# Patient Record
Sex: Female | Born: 1935
Health system: Southern US, Community
[De-identification: ages and names within clinical notes are randomized; demographics above are authoritative.]

## PROBLEM LIST (undated history)

## (undated) DIAGNOSIS — H905 Unspecified sensorineural hearing loss: Secondary | ICD-10-CM

## (undated) DIAGNOSIS — E785 Hyperlipidemia, unspecified: Secondary | ICD-10-CM

## (undated) DIAGNOSIS — K5790 Diverticulosis of intestine, part unspecified, without perforation or abscess without bleeding: Secondary | ICD-10-CM

## (undated) DIAGNOSIS — H9190 Unspecified hearing loss, unspecified ear: Secondary | ICD-10-CM

## (undated) DIAGNOSIS — K649 Unspecified hemorrhoids: Secondary | ICD-10-CM

## (undated) DIAGNOSIS — K219 Gastro-esophageal reflux disease without esophagitis: Secondary | ICD-10-CM

## (undated) DIAGNOSIS — T7840XA Allergy, unspecified, initial encounter: Secondary | ICD-10-CM

## (undated) DIAGNOSIS — I2510 Atherosclerotic heart disease of native coronary artery without angina pectoris: Secondary | ICD-10-CM

## (undated) DIAGNOSIS — Z780 Asymptomatic menopausal state: Secondary | ICD-10-CM

## (undated) DIAGNOSIS — I10 Essential (primary) hypertension: Secondary | ICD-10-CM

## (undated) DIAGNOSIS — M8580 Other specified disorders of bone density and structure, unspecified site: Secondary | ICD-10-CM

## (undated) HISTORY — DX: Atherosclerotic heart disease of native coronary artery without angina pectoris: I25.10

## (undated) HISTORY — DX: Allergy, unspecified, initial encounter: T78.40XA

## (undated) HISTORY — DX: Other specified disorders of bone density and structure, unspecified site: M85.80

## (undated) HISTORY — DX: Essential (primary) hypertension: I10

## (undated) HISTORY — DX: Diverticulosis of intestine, part unspecified, without perforation or abscess without bleeding: K57.90

## (undated) HISTORY — DX: Unspecified hearing loss, unspecified ear: H91.90

## (undated) HISTORY — DX: Hyperlipidemia, unspecified: E78.5

## (undated) HISTORY — DX: Asymptomatic menopausal state: Z78.0

## (undated) HISTORY — DX: Unspecified sensorineural hearing loss: H90.5

## (undated) HISTORY — DX: Unspecified hemorrhoids: K64.9

## (undated) HISTORY — DX: Gastro-esophageal reflux disease without esophagitis: K21.9

---

## 1997-07-29 ENCOUNTER — Other Ambulatory Visit: Admission: RE | Admit: 1997-07-29 | Discharge: 1997-07-29 | Payer: Self-pay | Admitting: Obstetrics and Gynecology

## 1999-04-29 ENCOUNTER — Other Ambulatory Visit: Admission: RE | Admit: 1999-04-29 | Discharge: 1999-04-29 | Payer: Self-pay | Admitting: Obstetrics and Gynecology

## 1999-04-30 ENCOUNTER — Encounter (INDEPENDENT_AMBULATORY_CARE_PROVIDER_SITE_OTHER): Payer: Self-pay

## 1999-04-30 ENCOUNTER — Other Ambulatory Visit: Admission: RE | Admit: 1999-04-30 | Discharge: 1999-04-30 | Payer: Self-pay | Admitting: Obstetrics and Gynecology

## 1999-07-02 LAB — HM MAMMOGRAPHY: HM Mammogram: NEGATIVE

## 1999-08-03 ENCOUNTER — Encounter: Payer: Self-pay | Admitting: Obstetrics and Gynecology

## 1999-08-04 ENCOUNTER — Ambulatory Visit (HOSPITAL_COMMUNITY): Admission: RE | Admit: 1999-08-04 | Discharge: 1999-08-04 | Payer: Self-pay | Admitting: Obstetrics and Gynecology

## 1999-08-04 ENCOUNTER — Encounter (INDEPENDENT_AMBULATORY_CARE_PROVIDER_SITE_OTHER): Payer: Self-pay

## 2000-01-05 DIAGNOSIS — I2510 Atherosclerotic heart disease of native coronary artery without angina pectoris: Secondary | ICD-10-CM

## 2000-01-05 HISTORY — DX: Atherosclerotic heart disease of native coronary artery without angina pectoris: I25.10

## 2000-02-29 ENCOUNTER — Emergency Department (HOSPITAL_COMMUNITY): Admission: EM | Admit: 2000-02-29 | Discharge: 2000-02-29 | Payer: Self-pay | Admitting: Emergency Medicine

## 2000-06-06 ENCOUNTER — Encounter: Payer: Self-pay | Admitting: *Deleted

## 2000-06-06 ENCOUNTER — Ambulatory Visit (HOSPITAL_COMMUNITY): Admission: RE | Admit: 2000-06-06 | Discharge: 2000-06-07 | Payer: Self-pay | Admitting: *Deleted

## 2001-01-04 HISTORY — PX: COLONOSCOPY: SHX174

## 2001-02-07 ENCOUNTER — Other Ambulatory Visit: Admission: RE | Admit: 2001-02-07 | Discharge: 2001-02-07 | Payer: Self-pay | Admitting: Obstetrics and Gynecology

## 2001-03-30 ENCOUNTER — Ambulatory Visit (HOSPITAL_COMMUNITY): Admission: RE | Admit: 2001-03-30 | Discharge: 2001-03-30 | Payer: Self-pay | Admitting: *Deleted

## 2001-03-30 ENCOUNTER — Encounter: Payer: Self-pay | Admitting: *Deleted

## 2002-04-27 ENCOUNTER — Other Ambulatory Visit: Admission: RE | Admit: 2002-04-27 | Discharge: 2002-04-27 | Payer: Self-pay | Admitting: Obstetrics and Gynecology

## 2002-09-12 ENCOUNTER — Ambulatory Visit (HOSPITAL_COMMUNITY): Admission: RE | Admit: 2002-09-12 | Discharge: 2002-09-12 | Payer: Self-pay | Admitting: Gastroenterology

## 2004-04-23 ENCOUNTER — Ambulatory Visit (HOSPITAL_COMMUNITY): Admission: RE | Admit: 2004-04-23 | Discharge: 2004-04-23 | Payer: Self-pay | Admitting: *Deleted

## 2004-06-10 ENCOUNTER — Encounter: Admission: RE | Admit: 2004-06-10 | Discharge: 2004-06-10 | Payer: Self-pay | Admitting: Family Medicine

## 2004-10-19 ENCOUNTER — Other Ambulatory Visit: Admission: RE | Admit: 2004-10-19 | Discharge: 2004-10-19 | Payer: Self-pay | Admitting: Family Medicine

## 2005-09-22 ENCOUNTER — Ambulatory Visit: Payer: Self-pay | Admitting: Family Medicine

## 2006-06-02 ENCOUNTER — Other Ambulatory Visit: Admission: RE | Admit: 2006-06-02 | Discharge: 2006-06-02 | Payer: Self-pay | Admitting: Family Medicine

## 2006-06-02 ENCOUNTER — Ambulatory Visit: Payer: Self-pay | Admitting: Family Medicine

## 2006-06-02 LAB — HM PAP SMEAR: HM Pap smear: NORMAL

## 2006-09-21 ENCOUNTER — Ambulatory Visit: Payer: Self-pay | Admitting: Family Medicine

## 2006-11-16 ENCOUNTER — Ambulatory Visit: Payer: Self-pay | Admitting: Family Medicine

## 2007-02-09 IMAGING — CR DG CHEST 2V
2 series · 2 of 2 positions shown · non-contrast
Comparison: none

CLINICAL DATA: Abnormal stress test.  Pre-cardiac catheterization.
 2-VIEW CHEST ? 04/23/04: 
 The lungs are clear with mild interstitial coarsening.  The heart size is within normal limits.  The bony structures of the imaged thorax are intact.

[view not recorded (1 of 2)]
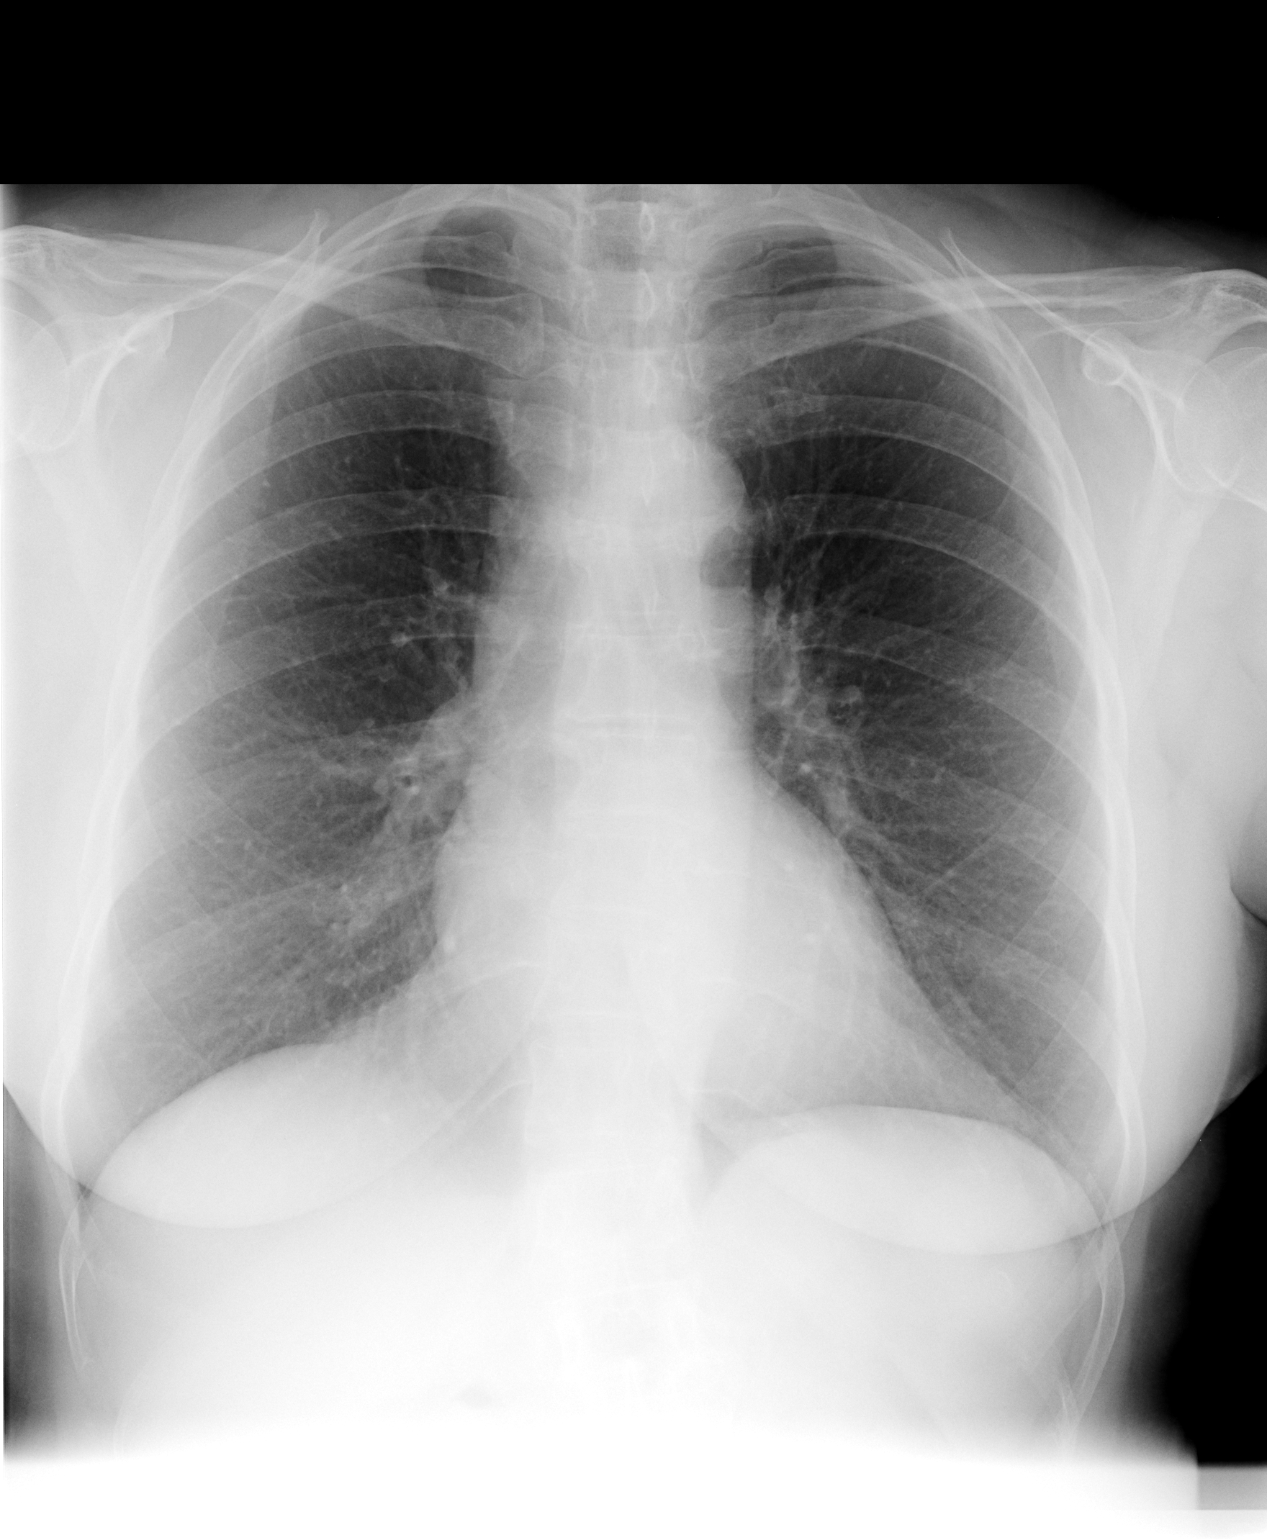

[view not recorded (2 of 2)]
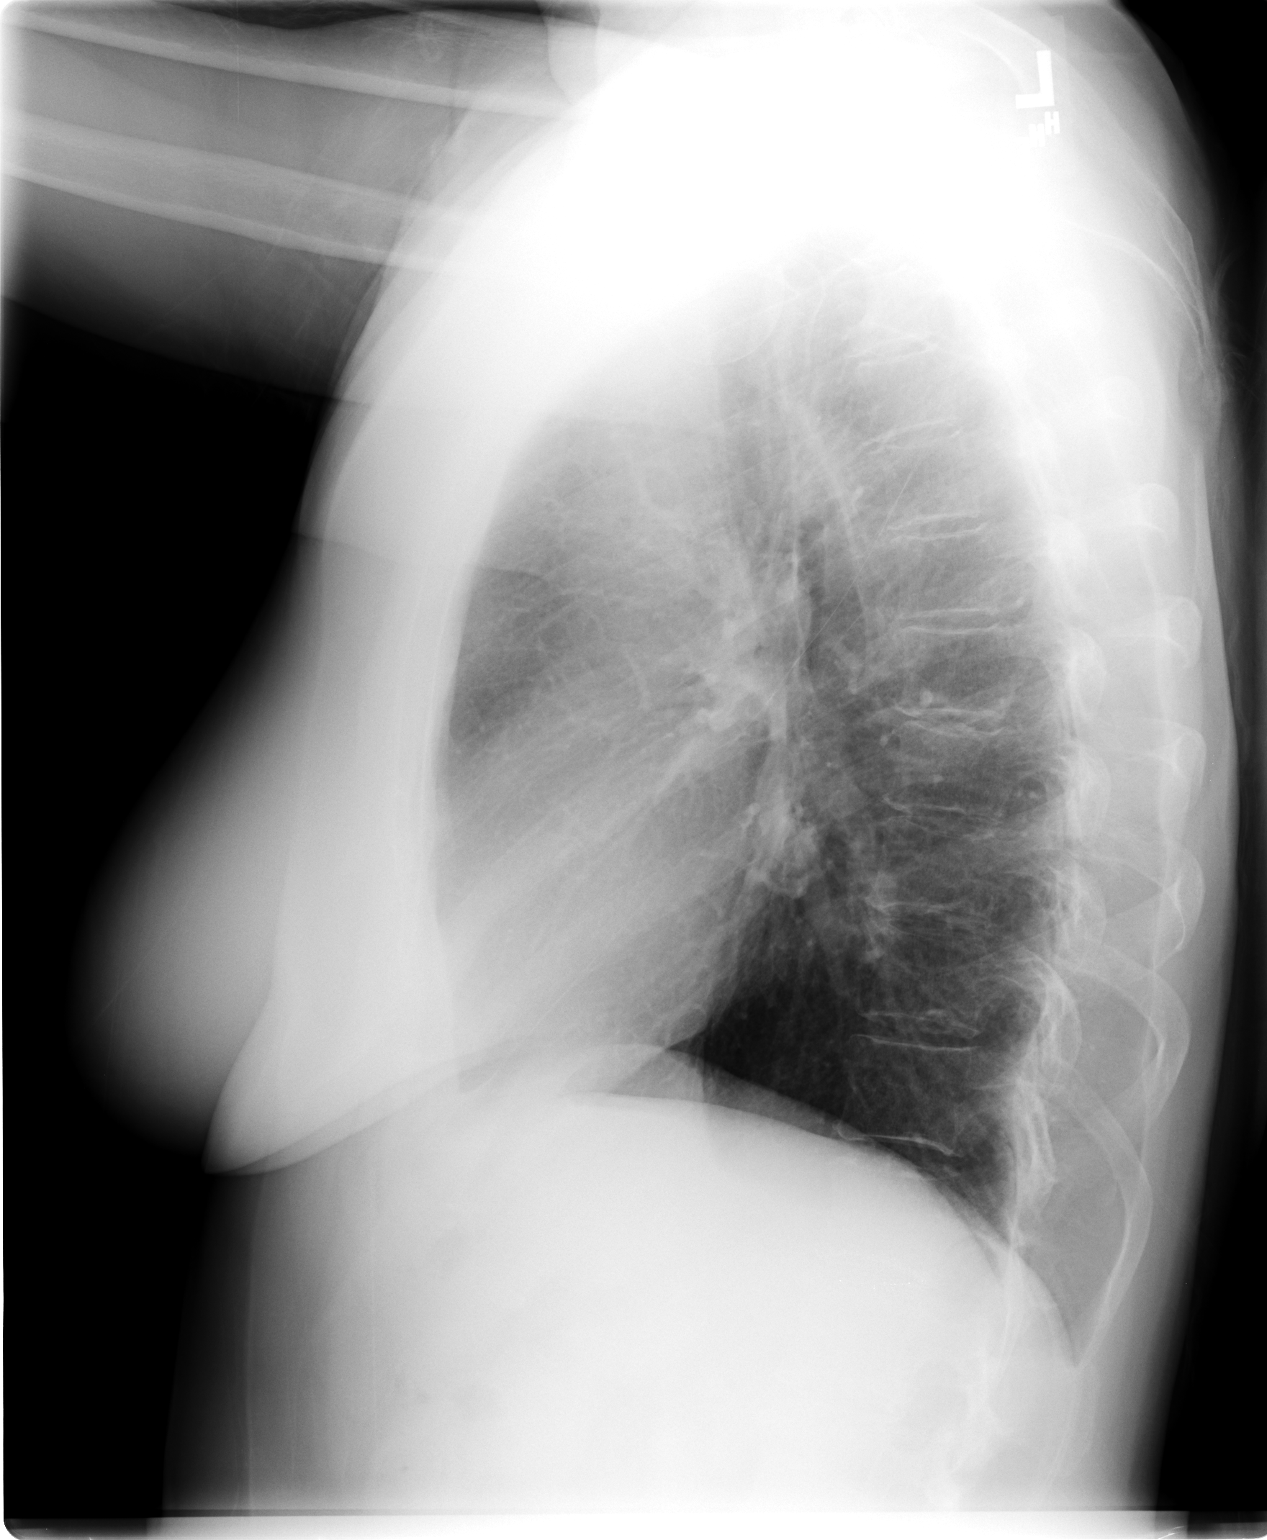

[2 of 2 positions shown; findings below may reference images not displayed]

IMPRESSION: No acute cardiopulmonary process.

## 2007-06-06 ENCOUNTER — Ambulatory Visit: Payer: Self-pay | Admitting: Family Medicine

## 2007-09-27 ENCOUNTER — Ambulatory Visit: Payer: Self-pay | Admitting: Family Medicine

## 2008-03-22 ENCOUNTER — Ambulatory Visit: Payer: Self-pay | Admitting: Family Medicine

## 2008-07-29 ENCOUNTER — Ambulatory Visit: Payer: Self-pay | Admitting: Family Medicine

## 2008-10-28 ENCOUNTER — Ambulatory Visit: Payer: Self-pay | Admitting: Family Medicine

## 2009-07-01 LAB — HM DEXA SCAN

## 2009-07-30 ENCOUNTER — Ambulatory Visit: Payer: Self-pay | Admitting: Family Medicine

## 2009-09-23 ENCOUNTER — Ambulatory Visit: Payer: Self-pay | Admitting: Family Medicine

## 2010-01-09 ENCOUNTER — Ambulatory Visit: Admit: 2010-01-09 | Payer: Self-pay | Admitting: Family Medicine

## 2010-01-13 ENCOUNTER — Ambulatory Visit
Admission: RE | Admit: 2010-01-13 | Discharge: 2010-01-13 | Payer: Self-pay | Source: Home / Self Care | Attending: Family Medicine | Admitting: Family Medicine

## 2010-04-17 ENCOUNTER — Ambulatory Visit (INDEPENDENT_AMBULATORY_CARE_PROVIDER_SITE_OTHER): Payer: Medicare Other | Admitting: Family Medicine

## 2010-04-17 DIAGNOSIS — N390 Urinary tract infection, site not specified: Secondary | ICD-10-CM

## 2010-05-11 ENCOUNTER — Encounter: Payer: Self-pay | Admitting: Family Medicine

## 2010-05-12 ENCOUNTER — Telehealth: Payer: Self-pay

## 2010-05-12 ENCOUNTER — Other Ambulatory Visit: Payer: Medicare Other

## 2010-05-12 ENCOUNTER — Other Ambulatory Visit: Payer: Self-pay | Admitting: Family Medicine

## 2010-05-12 DIAGNOSIS — N390 Urinary tract infection, site not specified: Secondary | ICD-10-CM

## 2010-05-12 LAB — POCT URINALYSIS DIPSTICK
Blood, UA: NEGATIVE
Protein, UA: NEGATIVE
Spec Grav, UA: 1.01
Urobilinogen, UA: NEGATIVE
pH, UA: 7

## 2010-05-12 MED ORDER — IBANDRONATE SODIUM 150 MG PO TABS: 150.0000 mg | ORAL_TABLET | ORAL | Status: DC

## 2010-05-12 NOTE — Telephone Encounter (Signed)
Calling pt to let her know that her UA is normal per [employer]

## 2010-05-22 NOTE — Op Note (Signed)
[facility]  Patient:    Tanya Berry, Tanya Berry                          MRN: [id number] Proc. Date: 08/04/99 Adm. Date:  [id number] Disc. Date: [id number] Attending:  Jenean Lindau                           Operative Report  PREOPERATIVE DIAGNOSIS:  Postmenopausal bleeding due to multiple polyps versus submucosal fibroids.  POSTOPERATIVE DIAGNOSIS:  Postmenopausal bleeding due to multiple polyps versus submucosal fibroids.  PROCEDURE PERFORMED:  Hysteroscopic resection.  SURGEON:  Laqueta Linden, M.D.  ANESTHESIA:  MAC sedation with paracervical block.  ESTIMATED BLOOD LOSS:  Less than 50 cc.  COMPLICATIONS:  None.  BRIEF HISTORY:  Tanya Berry is a 75 year old menopausal female on continuous combined hormonal replacement therapy who has had an intermittent history of abnormal bleeding.  She underwent endometrial sampling which was benign, but revealed a fragment of a polyp.  She subsequently underwent an ultrasound with sonohysterogram which revealed multiple polyps (at least three with the largest measuring 2.2 cm versus a submucosal fibroid in the endometrial cavity. The patient was given the alterative of vaginal hysterectomy versus resection.  She chose the latter.  She has seen the informed consent film and then counseled as to the risks, benefits, alternatives and complications of the procedure and agrees to proceed.  DESCRIPTION OF PROCEDURE:  The patient was taken to the operating room and after proper identification and consent was obtained, she was placed on the operating table in the supine position.  After IV sedation was accomplished, she was placed in the arm stirrups and the perineum and vagina were prepped and draped in routine sterile fashion.  A transurethral Foley was placed which was removed at the conclusion of the procedure.  Bimanual examination confirmed an anterior tiny mobile uterus with no obvious adnexal masses.  A speculum  was placed in the vagina.  The cervix was grasped with a single-tooth tenaculum.  A paracervical block utilizing 10 cc of 1% plain Xylocaine was then placed circumferentially.  The uterine cavity sounded to 8 cm.  The internal os was gently dilated to a #33 Pratt dilator.  The resectoscope with video and continuous Sorbitol infusion was then inserted.  The endocervical canal was free of lesions.  The endometrial cavity was filled with a large polypoid lesion that extended down into the lower uterine segment and upper cervical canal.  It was possible to advance the scope beneath this and view the base of this and the fundus which otherwise appeared clear.  This polyp appeared to be multilobed versus adjacent to additional polyps or fibroids. The resectoscope was placed on routine settings of cut and coag.  The double loop was used and the polyp was systematically resected.  At one point, the ring forceps were inserted and a large residual piece of tissue was removed. This allowed improved visualization of the upper fundus.  Several additional passages of the resectoscope were performed to remove the base of what was probably a submucosal fibroid.  At the conclusion of the procedure, the uterine cavity was entirely evacuated. The nonresected areas all appeared atrophic with some small calcifications. There were no additional lesions noted.  Both cornua were visualized.  The pressure was reduced and several small bleeding points were cauterized.  All instruments were then removed and tissue pieces were evacuated.  The  tenaculum site was hemostatic.  There was no active bleeding from the cervix.  Sorbitol intake was less than 100 cc.  Estimated blood loss less than 50 cc. Complications none.  The catheter was removed and the patient was stable on transfer to the recovery room.  She will be observed and discharged per anesthesia protocol.  She will be given routine discharge instructions  and told to call for excessive pain, bleeding or other concerns.  She is to continue on her routine medications including her hormones and take Advil or Aleve as needed for any cramping.  She is to follow up in the office in 4-6 weeks time.  She will follow up sooner for excessive pain, fever, bleeding or other concerns. DD:  08/04/99 TD:  08/05/99 Job: [id number]

## 2010-05-22 NOTE — Op Note (Signed)
   [name], Tanya Berry                             ACCOUNT NO.:  [id number]   MEDICAL RECORD NO.:  [id number]                   PATIENT TYPE:  AMB   LOCATION:  ENDO                                 FACILITY:  [facility]   PHYSICIAN:  Anselmo Rod, M.D.               DATE OF BIRTH:  Apr 11, 1935   DATE OF PROCEDURE:  09/12/2002  DATE OF DISCHARGE:                                 OPERATIVE REPORT   PROCEDURE PERFORMED:  Screening colonoscopy.   ENDOSCOPIST:  Charna Elizabeth, M.D.   INSTRUMENT USED:  Olympus video colonoscope.   INDICATIONS FOR PROCEDURE:  Screening colonoscopy being performed in a 75-  year-old white female.  Rule out colonic polyps, masses, etc.   PREPROCEDURE PREPARATION:  Informed consent was procured from the patient.  The patient was fasted for eight hours prior to the procedure and prepped  with a bottle of magnesium citrate and a gallon of GoLYTELY the night prior  to the procedure.   PREPROCEDURE PHYSICAL:  The patient had stable vital signs.  Neck supple.  Chest clear to auscultation.  S1 and S2 regular.  Abdomen soft with normal  bowel sounds.   DESCRIPTION OF PROCEDURE:  The patient was placed in left lateral decubitus  position and sedated with 75 mg of Demerol and 6 mg of Versed intravenously.  Once the patient was adequately sedated and maintained on low flow oxygen  and continuous cardiac monitoring, the Olympus video colonoscope was  advanced from the rectum to the cecum and terminal ileum.  The appendicular  orifice and ileocecal valve were clearly visualized and photographed. There  was evidence of sigmoid diverticulosis and internal hemorrhoids were seen on  retroflexion in the rectum.  The terminal ileum appeared normal.  No masses  or polyps were seen.   IMPRESSION:  1. Moderate sized internal hemorrhoids.  2. Sigmoid diverticulosis.  3. Normal-appearing transverse colon, right colon, cecum and terminal ileum.   RECOMMENDATIONS:  1. Continue a  high fiber diet with liberal fluid intake.  20 to 25g of fiber     in the diet has been recommended.  2. Repeat colorectal cancer screening is recommended in the next 10 years     unless the patient develops any abnormal symptoms in the interim.  3. Outpatient follow up as need arises in the future.                                                    Anselmo Rod, M.D.    JNM/MEDQ  D:  09/12/2002  T:  09/12/2002  Job:  [id number]   cc:   Sharlot Gowda, M.D.  [address] [place], [place] [address]  Fax: [phone]

## 2010-05-22 NOTE — Cardiovascular Report (Signed)
[place]. [facility]  Patient:    Tanya Berry, Tanya Berry                          MRN: [id number] Proc. Date: 06/06/00 Adm. Date:  [id number] Attending:  Darlin Priestly CC:         [name], M.D.   Cardiac Catheterization  PROCEDURES PERFORMED: 1. Left heart catheterization. 2. Coronary angiography. 3. Left ventriculogram. 4. Abdominal aortogram. 5. LAD - proximal.        - placement in intracoronary stent.  ATTENDING:  Lenise Herald, M.D.  COMPLICATIONS:  None.  INDICATIONS:  [name] is a 75 year old white female with a history of hypertension, left bundle branch block, hypercholesterolemia, remote smoking history, and positive family history of CAD, who recently complained of substernal chest pain radiating into both arms and associated with shortness of breath and diaphoresis.  She is now referred for cardiac catheterization to define her coronary anatomy.  DESCRIPTION OF PROCEDURE:  After giving informed written consent, the patient was brought to the cardiac catheterization laboratory where right and left groins were shaved, prepped, and draped in the usual sterile fashion.  ECG monitoring was established.  Using the modified Seldinger technique, a 6-French arterial sheath was inserted into the right femoral artery.  Six French diagnostic catheters were then used to perform diagnostic angiography which revealed the following.  The left main was medium sized with a 20% ostial stenosis.  There is noted to be pressure damping when engaged, however, this was improved with IV nitroglycerin.  The LAD is a medium sized vessel which coursed the apex and gave rise to two diagonal branches.  The LAD is noted to have a 95% proximal stenotic lesion. The first and second diagonals are small vessels with no significant disease.  The left circumflex is a large vessel which coursed in the AV groove and gave rise to three obtuse marginal branches.  The  AV groove circumflex has no significant disease.  The first OM is a large vessel with no significant disease.  The second OM is a medium sized vessel with no significant disease. The third OM is a large vessel with no significant disease.  The right coronary artery is a large vessel which is dominant and gives rise to both a PDA, as well as a posterolateral branch.  The RCA, PDA, and posterolateral branch have no significant disease.  Left ventriculogram reveals low-normal EF of 50% with mild apical hypokinesis.  Abdominal aortogram reveals no evidence of significant renal artery stenosis. There is a tortuous distal aorta.  HEMODYNAMICS:  Systemic arterial pressure 160/100, LV systemic pressure 166/14, LV-EDP of 18.  INTERVENTIONAL PROCEDURE:  LAD - PROXIMAL:  Following diagnostic angiography, a 6-French JL-4 guiding catheter with side-holes was crossed and engaged in the left coronary ostium.  Next, a 0.014 short Forte guide wire was advanced inside the guiding catheter and into the proximal LAD.  This was then used to cross the lesion successfully and lesion measurement was made using the Forte wire.  The wire was then positioned in the distal LAD without difficulty. Next, a Sci-Med NIR Elite 3.0 x 12 mm stent was then used to position across the stenotic lesion.  The stent was then deployed to a maximum of 14 atm for a total of 30 seconds.  One second inflation was then performed to a maximum of 14 atm for a total of 30 seconds.  A followup angiogram revealed  no evidence of dissection or thrombus with TIMI-3 flow to the distal vessel.  IV Integrilin was given.  An intravenous dose of heparin was given to maintain an ACT between 200 and 300.  Final orthogonal angiograms revealed less than 10% residual stenosis of the proximal LAD stenotic lesion with no evidence of dissection or thrombus.  At this point, we elected to conclude the procedure.  All balloons, wires, and catheters  were removed.  Hemostatic sheaths were sewn in place and the patient was returned back to the ward in stable condition.  CONCLUSIONS: 1. Successful placement of a NIR Elite 3.0 x 12 mm stent in the proximal    left anterior descending coronary artery stenotic lesion. 2. Low-normal estimated ejection fraction with one vessel abnormality, as    noted above. 3. No evidence of significant renal artery stenosis. 4. Systemic hypertension. 5. Adjunct use of Integrilin infusion. DD:  06/06/00 TD:  06/06/00 Job: [id number]

## 2010-05-22 NOTE — Cardiovascular Report (Signed)
[place]. [facility]  Patient:    CHEROKEE, BOCCIO Visit Number: [id number] MRN: [id number]          Service Type: CAT Location: [facility] 2867 01 Attending Physician:  Darlin Priestly Dictated by:   Lenise Herald, M.D. Proc. Date: 03/30/01 Admit Date:  03/30/2001 Discharge Date: 03/30/2001   CC:         Nathaneil Canary, M.D.   Cardiac Catheterization  PROCEDURES PERFORMED: 1. Left heart catheterization. 2. Coronary angiography. 3. Left ventriculogram.  ATTENDING PHYSICIAN:  Lenise Herald, M.D.  COMPLICATIONS:  None.  INDICATIONS:  The patient is a 75 year old female patient of Dr. Nathaneil Canary and Dr. Lenise Herald with a history of CAD, status post PTCA and stenting of her proximal LAD in June of 2002. The patient recently has complained of a mild chest pain and underwent a Cardiolite scan in February of 2003 revealing mild anterior wall ischemia, however, the patient is known to have left bundle branch block. Because of the proximity of her previously placed LAD stent, she is now referred for repeat catheterization to assess the patency of her stent.  DESCRIPTION OF PROCEDURE:  After obtaining informed written consent, the patient was brought to the cardiac catheterization laboratory where the right and left groins were shaved, prepped and draped in the usual sterile fashion. ECG monitoring was established. Using the modified Seldinger technique, an #6 [place] arterial sheath was inserted into the right femoral artery.  A #6 French diagnostic catheter was used to perform diagnostic angiography. This shows a large left main with mild 20% ostial narrowing. The LAD is a medium sized vessel which coursed at the apex and bifurcates to two diagonal branches. There is a stent noted in the proximal portion of the LAD with mild 20% in-stent restenosis. The remainder of the LAD has no sign of disease. The first and second diagonals are small vessels with no  significant disease.  The left circumflex is a large vessel which coursed in the AV groove and bifurcates into two PL marginal branches. The AV groove circumflex has significant disease. The first and second OMs are large vessels with no significant disease.  The right coronary artery is dominant and gives rise to both the posterior descending artery as well as the posterolateral branch. There is no significant disease in the RCA, PCA, or the posterolateral branch.  The left ventriculogram reveals preserved EF of 60%.  HEMODYNAMICS:  Systemic arterial pressure of 147/64, LV systemic pressure 146/12 and left ventricular end diastolic pressure of 19.  CONCLUSIONS: 1. No significant coronary artery disease with widely patent    left anterior descending artery stent. 2. Normal left ventricular systolic function. Dictated by:   Lenise Herald, M.D. Attending Physician:  Darlin Priestly DD:  03/30/01 TD:  03/31/01 Job: [id number]

## 2010-05-22 NOTE — H&P (Signed)
. [facility]  Patient:    Tanya Berry, Tanya Berry Visit Number: [id number] MRN: [id number]          Service Type: CAT Location: Tanya Berry 2867 01 Attending Physician:  Tanya Berry Dictated by:   Tanya Berry, P.A.C. Admit Date:  03/30/2001   CC:         Tanya Berry, M.D., primary care   History and Physical  HISTORY OF PRESENT ILLNESS:  Mrs. Tanya Berry is a pleasant 75 year old married white female with a history of coronary artery disease status post percutaneous transluminal coronary angioplasty and stenting of her proximal LAD on June 06, 2000.  She did well after that time until February, when she had one episode of chest pain.  That chest pain really was not that similar to her prior angina.  Dr. Jenne Berry scheduled her for a Cardiolite scan, as it had been eight months since her prior stent was placed.  This revealed mild anterior and anteroseptal wall ischemia and EF 55%.  She saw Dr. Jenne Berry back in the office on February 25.  At that time she had had no recurrent chest pain.  He increased her Norvasc to 10 mg at that time.  She was having some edema so he changed her Lotensin/HCTZ to Lotensin and Lasix.  He then scheduled her for cardiac catheterization and possible brachytherapy.  Risks and benefits of the procedure were discussed and she was agreeable with that approach.  She now presents for cardiac catheterization and possible brachytherapy.  PAST MEDICAL HISTORY: 1. CAD.    a. Status post cardiac catheterization with PTCA stent to the LAD on       June 06, 2000.    b. Status post Cardiolite in February 2003 which showed mild anterior       and anteroseptal wall ischemia, EF 65%. 2. Hypertension. 3. Hyperlipidemia. 4. 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  CURRENT MEDICATIONS: 1. Lasix - she has been taking 20 mg q.d. since Saturday.  She was supposed to    take it on a p.r.n. basis for lower extremity edema and she has needed    it daily. 2. Lotensin 20 mg q.d. 3. Nexium 40 mg q.d. 4. Norvasc 10 mg q.d. 5. Calcium 1200 mg q.d. 6. Toprol-XL 25 mg q.d. 7. Zocor 10 mg q.h.s. 8. Aspirin 81 mg q.d.  ALLERGIES:  No known drug allergies.  No reaction to the catheterization dye at previous procedure.  SOCIAL HISTORY:  She is married with two children, five grandchildren.  She does not smoke.  She is quite active.  FAMILY HISTORY:  Noncontributory.  REVIEW OF SYSTEMS:  She has had no GI bleed.  She has no diabetes.  She does have lipids.  She does have hypertension.  She has had recent edema of the lower extremities since increasing her Norvasc.  She has had to take Lasix daily because of this edema and she is concerned about the edema.  PHYSICAL EXAMINATION:  VITAL SIGNS:  Temperature 97.5, pulse 74, blood pressure 134/70, respirations 16.  GENERAL:  Well-nourished, well-developed white female, appears in no acute distress.  NECK:  Supple, no JVD, 2+ bilateral carotid pulses without bruits.  LUNGS:  Clear with good breath sounds throughout.  HEART:  Regular rhythm without murmur, rub, or gallop.  ABDOMEN:  Soft without mass, tenderness, or hepatosplenomegaly.  EXTREMITIES:  She has 1+ lower extremity edema bilaterally.  Good peripheral pulses.  LABORATORY DATA:  EKG was not performed today.  Her precatheterization labs are all stable and have been addressed.  Her renal function is stable with a creatinine of 0.9.  Hemoglobin 14.5, hematocrit 41.7.  PT/INR normal.  IMPRESSION AND PLAN:  At this time Mrs. Tanya Berry presents for cardiac catheterization status post recent Cardiolite revealing positive ischemia. She is status post LAD PTCA stent in June 2002.  She is currently stable with no chest pain or shortness of breath and is hemodynamically stable with good heart rate and blood pressure.  All risks and benefits of procedure have been discussed and she is agreeable to proceed with cardiac  catheterization with possible intervention and possible brachytherapy. Dictated by:   Tanya Berry, P.A.C. Attending Physician:  Tanya Berry DD:  03/30/01 TD:  03/30/01 Job: [id number]

## 2010-05-22 NOTE — Discharge Summary (Signed)
[place]. [facility]  Patient:    Tanya Berry, Tanya Berry                          MRN: [id number] Adm. Date:  [id number] Disc. Date: [id number] Attending:  Darlin Priestly Dictator:   Halford Decamp. Delanna Ahmadi, R.N., N.P. CC:         [name], M.D.   Discharge Summary  HISTORY OF PRESENT ILLNESS:  Tanya Berry is a 75 year old married white female patient of [name], M.D., and Lenise Herald, M.D., who apparently was having episodes of substernal chest pain for approximately three weeks prior to her cardiac catheterization.  She was seen in the office by Dr. Jenne Campus. Options were discussed and cardiac catheterization was decided.  HOSPITAL COURSE:  Thus, she came in the hospital for outpatient cardiac catheterization.  This was performed by Dr. Jenne Campus on June 06, 2000.  it revealed a 95% proximal LAD stenosis.  RCA and circumflex were clean. She had a 20% left main. She underwent PTCA and stenting to her LAD.  She did fine after the procedure. Her groin was without significant hematoma or bruise and she was considered stable to be discharged home on June 07, 2000.  She did receive 40 mEq of potassium in the morning for potassium of 3.2.  Her hemoglobin was stable and hematocrit was stable.  DISCHARGE MEDICATIONS: 1. Imdur 30 mg once per day. 2. Toprol XL 50 mg once per day. 3. Norvasc 5 mg once per day. 4. Lipitor 10 mg once per day. 5. Enteric coated aspirin 325 mg once per day. 6. Plavix 75 mg once per day x 1 month. 7. Lotensin HCT 10 mg once per day. 8. Prempro 0.625 mg once per day.  ACTIVITY:  She is to do no strenuous activity, no driving, no housework, no sexual activity x 4 days.  DIET:  She should be on a low fat diet.  DISCHARGE INSTRUCTIONS:  If she has any problems with her groin she will call our office.  FOLLOW-UP:  She will follow up with Dr. Jenne Campus on July 01, 2000, at 11:30.  DISCHARGE DIAGNOSES: 1. Unstable angina. 2. Coronary artery  disease status post cardiac catheterization with 95%    proximal left anterior descending stenosis with subsequent percutaneous    transluminal coronary angioplasty and stenting of her left anterior    descending.  Other arteries were clean.  No other disease except a 20% left    main. 3. Normal ejection fraction. 4. Hypertension. 5. Hyperlipidemia. DD:  06/07/00 TD:  06/08/00 Job: [id number]

## 2010-05-22 NOTE — Op Note (Signed)
Tanya Berry, Tanya Berry                 ACCOUNT NO.:  [id number]   MEDICAL RECORD NO.:  [id number]          PATIENT TYPE:  OIB   LOCATION:  2899                         FACILITY:  [facility]   PHYSICIAN:  Darlin Priestly, MD  DATE OF BIRTH:  04-11-1935   DATE OF PROCEDURE:  04/23/2004  DATE OF DISCHARGE:                                 OPERATIVE REPORT   PROCEDURES:  1.  Left heart catheterization.  2.  Coronary angiography.  3.  Left ventriculogram.   SURGEON:  Darlin Priestly, M.D.   COMPLICATIONS:  None.   INDICATIONS FOR PROCEDURE:  Ms. Peace is a 75 year old female patient of Dr.  Sharlot Gowda with history of CAD status post PTCA and stenting of proximal  LAD in June of 2002 using a NIR Elite 3.0 x 12 mm stent.  Repeat  catheterization in March of 2003 revealed a mild 20% in-stent restenosis.  She recently has had right-sided chest pain with repeat Cardiolite scan on  March 30, 2004, revealing mild anterolateral wall ischemia which was changed  from previous Cardiolite.  She is now for repeat catheterization to reassess  her coronary anatomy.   DESCRIPTION OF PROCEDURE:  After informed written consent, patient brought  to the cardiac catheterization lab where left groin was shaved, prepped and  draped in usual sterile fashion.  ECG monitor established.  Using modified  Seldinger technique, a #6 French arterial sheath inserted in right femoral  artery.  A 6 French diagnostic catheter was used to perform diagnostic  angiography.   The left main was mildly dampened when engaged with a 6 French catheter and  this was ultimately exchanged for a 5 [place].  There did not appear to  be any significant left main disease present.   LAD is a large vessel __________ two diagonal branches.  The stent noted in  the proximal portion of the LAD which appears to be patent with mild 30% in-  stent restenosis.  The remainder of his LAD is tortuous but has no  significant disease.  First  and second diagonals are small vessels with no  significant disease.   Left circumflex is __________ AV groove and gives rise to three obtuse  marginal branches.  AV groove circumflex has no significant disease.   The first and third OM are medium size vessels with no significant disease.  The second OM is a small vessel with no significant disease.   The right coronary artery is a large vessel which is dominant and gives rise  to PDA as well as posterolateral branch.  There is no significant disease in  the RCA, PDA or posterolateral branch.   Left ventriculogram reveals preserved EF of 60%.   Hemodynamics:  Systemic arterial pressure 172/85, LV systemic pressure  160/8, LVEDP 20.   CONCLUSION:  1.  No significant coronary artery disease.  2.  Normal left ventricular systolic function.  3.  Systemic hypertension.  4.  Elevated left ventricular end diastolic pressure.      RHM/MEDQ  D:  04/23/2004  T:  04/23/2004  Job:  [id number]  cc:   Sharlot Gowda, M.D.  [address]  [place], [place] [address]  Fax: [phone]

## 2010-06-26 ENCOUNTER — Ambulatory Visit (INDEPENDENT_AMBULATORY_CARE_PROVIDER_SITE_OTHER): Payer: Medicare Other | Admitting: Medical

## 2010-06-26 ENCOUNTER — Encounter: Payer: Self-pay | Admitting: Medical

## 2010-06-26 VITALS — BP 112/80 | HR 70 | Ht 65.0 in | Wt 152.0 lb

## 2010-06-26 DIAGNOSIS — D492 Neoplasm of unspecified behavior of bone, soft tissue, and skin: Secondary | ICD-10-CM

## 2010-06-26 NOTE — Progress Notes (Signed)
Subjective:   HPI  Tanya Berry is a 74 y.o. female who presents for skin lesion on her right hand x1 month. She thinks there may be pus inside and is worried about infection. The area started kind of small and gradually got bigger, but it hasn't really changed in the last week or 2.  Denies prior similar lesions, she does not have a dermatologist. No history of skin cancer.  No other aggravating or relieving factors.  No other c/o.  The following portions of the patient's history were reviewed and updated as appropriate: allergies, current medications, past family history, past medical history, past social history, past surgical history and problem list.  Past Medical History  Diagnosis Date  . Allergy seasonal rhinitis  . Menopause   . GERD (gastroesophageal reflux disease)   . Hypertension   . ASHD (arteriosclerotic heart disease) 2002    angioplasty  . Dyslipidemia   . Hemorrhoid   . Diverticulosis   . Osteopenia   . Hearing loss, sensorineural, high frequency    Review of Systems Constitutional: denies fever, chills, sweats Musculoskeletal: denies arthralgias, myalgias, joint swelling, back pain Neurology: no weakness, tingling, numbness     Objective:   Physical Exam  General appearance: alert, no distress, WD/WN Skin: Right dorsal hand just proximal to the fourth and fifth MCPs with a 9 mm round, raised, pink/red nodule, but there is a slight whitish area suggesting possible debris beneath, slightly tender, texture is dense, no fluctuance or induration. Pulses: 2+ symmetric, normal cap refill   Assessment :    Encounter Diagnosis  Name Primary?  . Neoplasm of skin of hand Yes     Plan:   Upon initial inspection the lesion seemed as if it could be a cyst, however I was concerned more for a dysplastic process. Patient wanted to at least try I&D to see if there was any cystic material. Thus we attempted. Cleaned and prepped in normal sterile fashion, using 1 cc of 1%  lidocaine without epinephrine for local anesthesia, using #11 blade to incise, however no cheesy material or pus expressed, tissue seemed dense throughout. We achieved hemostasis with direct pressure, covered with bandage. Discussed wound care. I advised patient that this lesion seems more concerning for neoplasm, likely squamous cell. We will refer to dermatology for further evaluation and management. No biopsy taken today.

## 2010-08-03 ENCOUNTER — Other Ambulatory Visit: Payer: Self-pay

## 2010-08-03 MED ORDER — OMEPRAZOLE 20 MG PO CPDR: 20.0000 mg | DELAYED_RELEASE_CAPSULE | Freq: Every day | ORAL | Status: DC

## 2010-08-03 MED ORDER — METOPROLOL SUCCINATE ER 25 MG PO TB24: 25.0000 mg | ORAL_TABLET | Freq: Every day | ORAL | Status: DC

## 2010-10-27 ENCOUNTER — Other Ambulatory Visit (INDEPENDENT_AMBULATORY_CARE_PROVIDER_SITE_OTHER): Payer: Medicare Other

## 2010-10-27 DIAGNOSIS — Z23 Encounter for immunization: Secondary | ICD-10-CM

## 2010-12-15 ENCOUNTER — Telehealth: Payer: Self-pay | Admitting: Internal Medicine

## 2010-12-15 ENCOUNTER — Other Ambulatory Visit: Payer: Self-pay

## 2010-12-15 MED ORDER — AMLODIPINE BESYLATE 5 MG PO TABS: 5.0000 mg | ORAL_TABLET | Freq: Every day | ORAL | Status: DC

## 2010-12-15 NOTE — Telephone Encounter (Signed)
Sent med in 

## 2010-12-15 NOTE — Telephone Encounter (Signed)
Pt also wanted rx till med got [facility]

## 2011-01-12 DIAGNOSIS — S058X9A Other injuries of unspecified eye and orbit, initial encounter: Secondary | ICD-10-CM | POA: Diagnosis not present

## 2011-01-15 ENCOUNTER — Telehealth: Payer: Self-pay | Admitting: Internal Medicine

## 2011-01-15 MED ORDER — METOPROLOL SUCCINATE ER 25 MG PO TB24: 25.0000 mg | ORAL_TABLET | Freq: Every day | ORAL | Status: DC

## 2011-01-15 MED ORDER — VALSARTAN-HYDROCHLOROTHIAZIDE 160-12.5 MG PO TABS: 1.0000 | ORAL_TABLET | Freq: Every day | ORAL | Status: DC

## 2011-01-15 MED ORDER — OMEPRAZOLE 20 MG PO CPDR: 20.0000 mg | DELAYED_RELEASE_CAPSULE | Freq: Every day | ORAL | Status: DC

## 2011-01-15 NOTE — Telephone Encounter (Signed)
[facility] all [facility] day 1 refill

## 2011-01-15 NOTE — Telephone Encounter (Signed)
Sent med in 

## 2011-01-18 DIAGNOSIS — H2510 Age-related nuclear cataract, unspecified eye: Secondary | ICD-10-CM | POA: Diagnosis not present

## 2011-01-18 DIAGNOSIS — H269 Unspecified cataract: Secondary | ICD-10-CM | POA: Diagnosis not present

## 2011-07-09 ENCOUNTER — Telehealth: Payer: Self-pay | Admitting: Family Medicine

## 2011-07-09 MED ORDER — AMLODIPINE BESYLATE 5 MG PO TABS: 5.0000 mg | ORAL_TABLET | Freq: Every day | ORAL | Status: DC

## 2011-07-09 MED ORDER — VALSARTAN-HYDROCHLOROTHIAZIDE 160-12.5 MG PO TABS: 1.0000 | ORAL_TABLET | Freq: Every day | ORAL | Status: DC

## 2011-07-09 MED ORDER — OMEPRAZOLE 20 MG PO CPDR: 20.0000 mg | DELAYED_RELEASE_CAPSULE | Freq: Every day | ORAL | Status: DC

## 2011-07-09 MED ORDER — METOPROLOL SUCCINATE ER 25 MG PO TB24: 25.0000 mg | ORAL_TABLET | Freq: Every day | ORAL | Status: DC

## 2011-07-09 NOTE — Telephone Encounter (Signed)
RX refills was sent in for the patient and i also called a lmom notifying the patient to schedule an office visit before her medications runs out. CLS

## 2011-08-05 ENCOUNTER — Ambulatory Visit (INDEPENDENT_AMBULATORY_CARE_PROVIDER_SITE_OTHER): Payer: Medicare Other | Admitting: Family Medicine

## 2011-08-05 ENCOUNTER — Encounter: Payer: Self-pay | Admitting: Family Medicine

## 2011-08-05 VITALS — BP 118/80 | HR 75 | Ht 64.0 in | Wt 148.0 lb

## 2011-08-05 DIAGNOSIS — M810 Age-related osteoporosis without current pathological fracture: Secondary | ICD-10-CM | POA: Insufficient documentation

## 2011-08-05 DIAGNOSIS — Z79899 Other long term (current) drug therapy: Secondary | ICD-10-CM

## 2011-08-05 DIAGNOSIS — M949 Disorder of cartilage, unspecified: Secondary | ICD-10-CM

## 2011-08-05 DIAGNOSIS — M899 Disorder of bone, unspecified: Secondary | ICD-10-CM | POA: Diagnosis not present

## 2011-08-05 DIAGNOSIS — I2510 Atherosclerotic heart disease of native coronary artery without angina pectoris: Secondary | ICD-10-CM | POA: Diagnosis not present

## 2011-08-05 DIAGNOSIS — I10 Essential (primary) hypertension: Secondary | ICD-10-CM

## 2011-08-05 DIAGNOSIS — E785 Hyperlipidemia, unspecified: Secondary | ICD-10-CM | POA: Insufficient documentation

## 2011-08-05 DIAGNOSIS — M8580 Other specified disorders of bone density and structure, unspecified site: Secondary | ICD-10-CM

## 2011-08-05 LAB — CBC WITH DIFFERENTIAL/PLATELET
Basophils Absolute: 0.1 10*3/uL (ref 0.0–0.1)
Eosinophils Absolute: 0.1 10*3/uL (ref 0.0–0.7)
Eosinophils Relative: 3 % (ref 0–5)
Lymphocytes Relative: 26 % (ref 12–46)
MCV: 82.1 fL (ref 78.0–100.0)
Neutrophils Relative %: 61 % (ref 43–77)
Platelets: 243 10*3/uL (ref 150–400)
RDW: 13.5 % (ref 11.5–15.5)
WBC: 4.6 10*3/uL (ref 4.0–10.5)

## 2011-08-05 LAB — COMPREHENSIVE METABOLIC PANEL
ALT: 16 U/L (ref 0–35)
AST: 20 U/L (ref 0–37)
Creat: 1.1 mg/dL (ref 0.50–1.10)
Sodium: 140 mEq/L (ref 135–145)
Total Bilirubin: 0.8 mg/dL (ref 0.3–1.2)

## 2011-08-05 LAB — LIPID PANEL
LDL Cholesterol: 74 mg/dL (ref 0–99)
Total CHOL/HDL Ratio: 2.7 Ratio
VLDL: 28 mg/dL (ref 0–40)

## 2011-08-05 MED ORDER — VALSARTAN-HYDROCHLOROTHIAZIDE 160-12.5 MG PO TABS: 1.0000 | ORAL_TABLET | Freq: Every day | ORAL | Status: DC

## 2011-08-05 MED ORDER — METOPROLOL SUCCINATE ER 25 MG PO TB24: 25.0000 mg | ORAL_TABLET | Freq: Every day | ORAL | Status: DC

## 2011-08-05 MED ORDER — AMLODIPINE BESYLATE 5 MG PO TABS: 5.0000 mg | ORAL_TABLET | Freq: Every day | ORAL | Status: DC

## 2011-08-05 NOTE — Progress Notes (Signed)
  Subjective:    Patient ID: Tanya Berry, female    DOB: 03-15-1935, 76 y.o.   MRN: [id number]  HPI She is here for an interval evaluation. She does have underlying ASHD however she's had no chest pain, shortness of breath, PND. She has not seen her cardiologist in several years. She continues on her blood pressure medications and is having no difficulty with them. She is taking Lipitor at 10 mg instead of 20. When she takes it at 20 mg, she does get myalgias to She also takes Boniva and does have a history of osteopenia. She does have a DEXA scan scheduled in the near future. Her social history was reviewed. She does not smoke or drink. Her marriage is going quite well. She occasionally uses Prilosec to   Review of Systems     Objective:   Physical Exam alert and in no distress. Tympanic membranes and canals are normal. Throat is clear. Tonsils are normal. Neck is supple without adenopathy or thyromegaly. Cardiac exam shows a regular sinus rhythm without murmurs or gallops. Lungs are clear to auscultation.  KG shows no changes from previous LBBB      Assessment & Plan:   1. ASHD (arteriosclerotic heart disease)  PR ELECTROCARDIOGRAM, COMPLETE  2. Hypertension  CBC with Differential, Comprehensive metabolic panel, amLODipine (NORVASC) 5 MG tablet, metoprolol succinate (TOPROL-XL) 25 MG 24 hr tablet, valsartan-hydrochlorothiazide (DIOVAN-HCT) 160-12.5 MG per tablet  3. Hyperlipidemia LDL goal < 70  Lipid panel  4. Osteopenia    5. Encounter for long-term (current) use of other medications  CBC with Differential, Comprehensive metabolic panel, Lipid panel   relative statin intolerance Her meds will be renewed except Lipitor. I will wait on the results and possibly switch.

## 2011-08-06 ENCOUNTER — Other Ambulatory Visit: Payer: Self-pay

## 2011-08-06 MED ORDER — ATORVASTATIN CALCIUM 20 MG PO TABS: 20.0000 mg | ORAL_TABLET | Freq: Every day | ORAL | Status: DC

## 2011-08-06 NOTE — Progress Notes (Signed)
  Subjective:    Patient ID: Tanya Berry, female    DOB: 21-Sep-1935, 76 y.o.   MRN: [id number]  HPI    Review of Systems     Objective:   Physical Exam        Assessment & Plan:  The glipizide good. She will continue on her present medication regimen

## 2011-08-06 NOTE — Progress Notes (Signed)
Pt informed

## 2011-08-06 NOTE — Addendum Note (Signed)
Addended by: [name] on: 08/06/2011 12:30 PM   Modules accepted: Orders

## 2011-08-12 ENCOUNTER — Telehealth: Payer: Self-pay | Admitting: Family Medicine

## 2011-08-12 MED ORDER — ATORVASTATIN CALCIUM 20 MG PO TABS: ORAL_TABLET | ORAL | Status: DC

## 2011-08-12 NOTE — Telephone Encounter (Signed)
[facility] At[facility]atin tabs 20 mg     There are two directions on this lipitor prescription,   Please provide the intended directions.

## 2011-09-28 ENCOUNTER — Other Ambulatory Visit: Payer: Self-pay | Admitting: Family Medicine

## 2011-10-27 ENCOUNTER — Other Ambulatory Visit: Payer: Medicare Other

## 2011-10-27 DIAGNOSIS — Z23 Encounter for immunization: Secondary | ICD-10-CM

## 2011-10-27 MED ORDER — INFLUENZA VIRUS VACC SPLIT PF IM SUSP: 0.5000 mL | INTRAMUSCULAR | Status: AC

## 2011-12-13 DIAGNOSIS — M899 Disorder of bone, unspecified: Secondary | ICD-10-CM | POA: Diagnosis not present

## 2011-12-13 DIAGNOSIS — Z1231 Encounter for screening mammogram for malignant neoplasm of breast: Secondary | ICD-10-CM | POA: Diagnosis not present

## 2011-12-13 DIAGNOSIS — M949 Disorder of cartilage, unspecified: Secondary | ICD-10-CM | POA: Diagnosis not present

## 2011-12-15 ENCOUNTER — Encounter: Payer: Self-pay | Admitting: Family Medicine

## 2011-12-16 ENCOUNTER — Encounter: Payer: Self-pay | Admitting: Internal Medicine

## 2012-02-08 ENCOUNTER — Other Ambulatory Visit: Payer: Self-pay | Admitting: Family Medicine

## 2012-03-28 DIAGNOSIS — H524 Presbyopia: Secondary | ICD-10-CM | POA: Diagnosis not present

## 2012-03-28 DIAGNOSIS — H40019 Open angle with borderline findings, low risk, unspecified eye: Secondary | ICD-10-CM | POA: Diagnosis not present

## 2012-07-15 ENCOUNTER — Other Ambulatory Visit: Payer: Self-pay | Admitting: Family Medicine

## 2012-07-30 ENCOUNTER — Other Ambulatory Visit: Payer: Self-pay | Admitting: Family Medicine

## 2012-08-06 ENCOUNTER — Other Ambulatory Visit: Payer: Self-pay | Admitting: Family Medicine

## 2012-09-26 ENCOUNTER — Encounter: Payer: Self-pay | Admitting: Family Medicine

## 2012-09-26 ENCOUNTER — Ambulatory Visit (INDEPENDENT_AMBULATORY_CARE_PROVIDER_SITE_OTHER): Payer: Medicare Other | Admitting: Family Medicine

## 2012-09-26 VITALS — BP 122/70 | HR 70 | Ht 63.5 in | Wt 154.0 lb

## 2012-09-26 DIAGNOSIS — E785 Hyperlipidemia, unspecified: Secondary | ICD-10-CM | POA: Diagnosis not present

## 2012-09-26 DIAGNOSIS — Z79899 Other long term (current) drug therapy: Secondary | ICD-10-CM

## 2012-09-26 DIAGNOSIS — M899 Disorder of bone, unspecified: Secondary | ICD-10-CM | POA: Diagnosis not present

## 2012-09-26 DIAGNOSIS — E1169 Type 2 diabetes mellitus with other specified complication: Secondary | ICD-10-CM | POA: Diagnosis not present

## 2012-09-26 DIAGNOSIS — M8580 Other specified disorders of bone density and structure, unspecified site: Secondary | ICD-10-CM

## 2012-09-26 DIAGNOSIS — I2510 Atherosclerotic heart disease of native coronary artery without angina pectoris: Secondary | ICD-10-CM

## 2012-09-26 DIAGNOSIS — I10 Essential (primary) hypertension: Secondary | ICD-10-CM | POA: Diagnosis not present

## 2012-09-26 DIAGNOSIS — Z23 Encounter for immunization: Secondary | ICD-10-CM

## 2012-09-26 LAB — LIPID PANEL
HDL: 65 mg/dL (ref >39)
Total CHOL/HDL Ratio: 2.4 Ratio
Triglycerides: 100 mg/dL (ref <150)
VLDL: 20 mg/dL (ref 0–40)

## 2012-09-26 LAB — COMPREHENSIVE METABOLIC PANEL
BUN: 15 mg/dL (ref 6–23)
CO2: 30 mEq/L (ref 19–32)
Creat: 1.08 mg/dL (ref 0.50–1.10)
Glucose, Bld: 101 mg/dL — ABNORMAL HIGH (ref 70–99)
Sodium: 140 mEq/L (ref 135–145)
Total Bilirubin: 0.5 mg/dL (ref 0.3–1.2)

## 2012-09-26 LAB — CBC WITH DIFFERENTIAL/PLATELET
Basophils Absolute: 0 10*3/uL (ref 0.0–0.1)
Eosinophils Relative: 3 % (ref 0–5)
HCT: 43.4 % (ref 36.0–46.0)
Lymphocytes Relative: 27 % (ref 12–46)
Lymphs Abs: 1.3 10*3/uL (ref 0.7–4.0)
MCH: 29.9 pg (ref 26.0–34.0)
Monocytes Absolute: 0.4 10*3/uL (ref 0.1–1.0)
Neutro Abs: 2.8 10*3/uL (ref 1.7–7.7)
RDW: 13.7 % (ref 11.5–15.5)
WBC: 4.7 10*3/uL (ref 4.0–10.5)

## 2012-09-26 MED ORDER — METOPROLOL SUCCINATE ER 25 MG PO TB24: ORAL_TABLET | ORAL | Status: DC

## 2012-09-26 MED ORDER — OMEPRAZOLE 20 MG PO CPDR: DELAYED_RELEASE_CAPSULE | ORAL | Status: DC

## 2012-09-26 MED ORDER — AMLODIPINE BESYLATE 5 MG PO TABS: ORAL_TABLET | ORAL | Status: DC

## 2012-09-26 MED ORDER — ATORVASTATIN CALCIUM 20 MG PO TABS: ORAL_TABLET | ORAL | Status: DC

## 2012-09-26 MED ORDER — VALSARTAN-HYDROCHLOROTHIAZIDE 160-12.5 MG PO TABS: ORAL_TABLET | ORAL | Status: DC

## 2012-09-26 NOTE — Progress Notes (Signed)
  Subjective:    Patient ID: Tanya Berry, female    DOB: 08/22/1935, 77 y.o.   MRN: [id number]  HPI She is here for routine medication check. She continues on medications listed in the chart. She has had 2 episodes since being seen in the last year of sudden onset of diaphoresis and weakness but no shortness of breath chest pain. The symptoms only lasted one or 2 minutes. Otherwise she has had no problems. She has no other problems or concerns.   Review of Systems     Objective:   Physical Exam alert and in no distress. Tympanic membranes and canals are normal. Throat is clear. Tonsils are normal. Neck is supple without adenopathy or thyromegaly. Cardiac exam shows a regular sinus rhythm without murmurs or gallops. Lungs are clear to auscultation. EKG shows no changes. She does have LBBB       Assessment & Plan:  Hyperlipidemia LDL goal < 70 - Plan: Lipid panel, atorvastatin (LIPITOR) 20 MG tablet  ASHD (arteriosclerotic heart disease) - Plan: EKG 12-Lead, Pneumococcal polysaccharide vaccine 23-valent greater than or equal to 2yo subcutaneous/IM, CBC with Differential, Lipid panel, metoprolol succinate (TOPROL-XL) 25 MG 24 hr tablet  Hypertension - Plan: Pneumococcal polysaccharide vaccine 23-valent greater than or equal to 2yo subcutaneous/IM, CBC with Differential, amLODipine (NORVASC) 5 MG tablet, valsartan-hydrochlorothiazide (DIOVAN-HCT) 160-12.5 MG per tablet  Osteopenia  Encounter for long-term (current) use of other medications - Plan: Flu Vaccine QUAD 36+ mos IM, Pneumococcal polysaccharide vaccine 23-valent greater than or equal to 2yo subcutaneous/IM, CBC with Differential, Lipid panel, Comprehensive metabolic panel, omeprazole (PRILOSEC) 20 MG capsule    I encouraged her to continue to take good care of herself.

## 2012-09-27 NOTE — Progress Notes (Signed)
Quick Note:  CALLED PT TALKED TO HUSBAND HE VERBALIZED UNDERSTANDING ______

## 2013-02-01 ENCOUNTER — Telehealth: Payer: Self-pay | Admitting: Family Medicine

## 2013-02-01 MED ORDER — ATORVASTATIN CALCIUM 10 MG PO TABS: 10.0000 mg | ORAL_TABLET | Freq: Every day | ORAL | Status: DC

## 2013-02-01 NOTE — Telephone Encounter (Signed)
Pt called wanting her Atorvastatin script to say 10 mg qd instead of 20 mg 1/[facility].

## 2013-02-05 ENCOUNTER — Telehealth: Payer: Self-pay | Admitting: Family Medicine

## 2013-02-05 DIAGNOSIS — Z1211 Encounter for screening for malignant neoplasm of colon: Secondary | ICD-10-CM

## 2013-02-05 NOTE — Telephone Encounter (Signed)
Check her record and see who did it and go ahead and refer her back.

## 2013-02-05 NOTE — Telephone Encounter (Signed)
Referral sent to [facility]

## 2013-02-05 NOTE — Telephone Encounter (Signed)
Spoke with pt

## 2013-02-12 ENCOUNTER — Encounter: Payer: Self-pay | Admitting: Gastroenterology

## 2013-03-13 ENCOUNTER — Ambulatory Visit: Admitting: Gastroenterology

## 2013-03-14 ENCOUNTER — Encounter: Payer: Self-pay | Admitting: Family Medicine

## 2013-08-15 ENCOUNTER — Other Ambulatory Visit: Payer: Self-pay | Admitting: Family Medicine

## 2013-08-17 ENCOUNTER — Other Ambulatory Visit: Payer: Self-pay | Admitting: Family Medicine

## 2013-08-18 ENCOUNTER — Other Ambulatory Visit: Payer: Self-pay | Admitting: Family Medicine

## 2013-09-23 ENCOUNTER — Other Ambulatory Visit: Payer: Self-pay | Admitting: Family Medicine

## 2013-10-01 ENCOUNTER — Encounter: Payer: Self-pay | Admitting: Family Medicine

## 2013-10-01 ENCOUNTER — Telehealth: Payer: Self-pay | Admitting: Internal Medicine

## 2013-10-01 ENCOUNTER — Ambulatory Visit (INDEPENDENT_AMBULATORY_CARE_PROVIDER_SITE_OTHER): Payer: Medicare Other | Admitting: Family Medicine

## 2013-10-01 VITALS — BP 120/70 | HR 60 | Ht 62.5 in | Wt 148.0 lb

## 2013-10-01 DIAGNOSIS — Z23 Encounter for immunization: Secondary | ICD-10-CM | POA: Diagnosis not present

## 2013-10-01 DIAGNOSIS — M899 Disorder of bone, unspecified: Secondary | ICD-10-CM

## 2013-10-01 DIAGNOSIS — Z79899 Other long term (current) drug therapy: Secondary | ICD-10-CM | POA: Diagnosis not present

## 2013-10-01 DIAGNOSIS — M949 Disorder of cartilage, unspecified: Secondary | ICD-10-CM

## 2013-10-01 DIAGNOSIS — I2510 Atherosclerotic heart disease of native coronary artery without angina pectoris: Secondary | ICD-10-CM | POA: Diagnosis not present

## 2013-10-01 DIAGNOSIS — E785 Hyperlipidemia, unspecified: Secondary | ICD-10-CM | POA: Diagnosis not present

## 2013-10-01 DIAGNOSIS — M549 Dorsalgia, unspecified: Secondary | ICD-10-CM | POA: Diagnosis not present

## 2013-10-01 DIAGNOSIS — I10 Essential (primary) hypertension: Secondary | ICD-10-CM

## 2013-10-01 DIAGNOSIS — G8929 Other chronic pain: Secondary | ICD-10-CM | POA: Insufficient documentation

## 2013-10-01 DIAGNOSIS — M8580 Other specified disorders of bone density and structure, unspecified site: Secondary | ICD-10-CM

## 2013-10-01 LAB — CBC WITH DIFFERENTIAL/PLATELET
BASOS ABS: 0 10*3/uL (ref 0.0–0.1)
Basophils Relative: 1 % (ref 0–1)
Eosinophils Absolute: 0.2 10*3/uL (ref 0.0–0.7)
Eosinophils Relative: 4 % (ref 0–5)
HCT: 44.3 % (ref 36.0–46.0)
Hemoglobin: 14.9 g/dL (ref 12.0–15.0)
LYMPHS PCT: 28 % (ref 12–46)
Lymphs Abs: 1.3 10*3/uL (ref 0.7–4.0)
MCH: 28.4 pg (ref 26.0–34.0)
MCHC: 33.6 g/dL (ref 30.0–36.0)
MCV: 84.4 fL (ref 78.0–100.0)
Monocytes Absolute: 0.5 10*3/uL (ref 0.1–1.0)
Monocytes Relative: 11 % (ref 3–12)
NEUTROS PCT: 56 % (ref 43–77)
Neutro Abs: 2.6 10*3/uL (ref 1.7–7.7)
PLATELETS: 227 10*3/uL (ref 150–400)
RBC: 5.25 MIL/uL — ABNORMAL HIGH (ref 3.87–5.11)
RDW: 14.5 % (ref 11.5–15.5)
WBC: 4.6 10*3/uL (ref 4.0–10.5)

## 2013-10-01 LAB — COMPREHENSIVE METABOLIC PANEL
ALBUMIN: 4.3 g/dL (ref 3.5–5.2)
ALT: 18 U/L (ref 0–35)
AST: 20 U/L (ref 0–37)
Alkaline Phosphatase: 63 U/L (ref 39–117)
BUN: 13 mg/dL (ref 6–23)
CALCIUM: 9.6 mg/dL (ref 8.4–10.5)
CHLORIDE: 103 meq/L (ref 96–112)
CO2: 29 mEq/L (ref 19–32)
Creat: 1.06 mg/dL (ref 0.50–1.10)
Glucose, Bld: 105 mg/dL — ABNORMAL HIGH (ref 70–99)
Potassium: 4.1 mEq/L (ref 3.5–5.3)
Sodium: 140 mEq/L (ref 135–145)
Total Bilirubin: 0.5 mg/dL (ref 0.2–1.2)
Total Protein: 6.9 g/dL (ref 6.0–8.3)

## 2013-10-01 LAB — LIPID PANEL
CHOL/HDL RATIO: 2.6 ratio
Cholesterol: 161 mg/dL (ref 0–200)
HDL: 62 mg/dL (ref >39)
LDL Cholesterol: 70 mg/dL (ref 0–99)
Triglycerides: 144 mg/dL (ref <150)
VLDL: 29 mg/dL (ref 0–40)

## 2013-10-01 MED ORDER — METOPROLOL SUCCINATE ER 25 MG PO TB24: 25.0000 mg | ORAL_TABLET | Freq: Every day | ORAL | Status: DC

## 2013-10-01 MED ORDER — VALSARTAN-HYDROCHLOROTHIAZIDE 160-12.5 MG PO TABS: ORAL_TABLET | ORAL | Status: DC

## 2013-10-01 MED ORDER — AMLODIPINE BESYLATE 5 MG PO TABS: ORAL_TABLET | ORAL | Status: DC

## 2013-10-01 NOTE — Patient Instructions (Addendum)
Stop the Lipitor for roughly one week and see if it makes any difference. If it does let me now if it doesn't make any difference and go back on it.Marland Kitchen

## 2013-10-01 NOTE — Telephone Encounter (Signed)
done

## 2013-10-01 NOTE — Telephone Encounter (Signed)
Go ahead and arrange this. It is for a good back rehabilitation program

## 2013-10-01 NOTE — Telephone Encounter (Signed)
Pt states she would like to go to [facility] in [place] for her PT. That is where her sister is going and highly recommends going there. Please Fax over a written order for PT @ [phone]

## 2013-10-01 NOTE — Progress Notes (Signed)
Subjective:    Patient ID: Tanya Berry, female    DOB: 21-Jun-1935, 78 y.o.   MRN: [id number]  HPI She is here for medication check. She does continue to have difficulty with low back pain. She has seen orthopedics in the past and they have recommended conservative care. She notes that sitting for periods of time causes pain. No numbness tingling or weakness. She does have underlying ASHD however has had no chest pain, shortness of breath, weakness, PND or orthopnea. Her last EKG showed no changes. She continues on her blood pressure medications. She also is on Lipitor. She also has a history of osteopenia and is scheduled for a DEXA scan beginning of the year. She will followup with gynecologist at that time. She plans to have her last mammogram done then as well.   Review of Systems  All other systems reviewed and are negative.      Objective:   Physical Exam BP 120/70  Pulse 60  Ht 5' 2.5" (1.588 m)  Wt 148 lb (67.132 kg)  BMI 26.62 kg/m2  SpO2 98%  General Appearance:    Alert, cooperative, no distress, appears stated age  Head:    Normocephalic, without obvious abnormality, atraumatic  Eyes:    PERRL, conjunctiva/corneas clear, EOM's intact,    Ears:    Normal TM's and external ear canals  Nose:   Nares normal, mucosa normal, no drainage or sinus   tenderness  Throat:   Lips, mucosa, and tongue normal; teeth and gums normal  Neck:   Supple, no lymphadenopathy;  thyroid:  no   enlargement/tenderness/nodules; no carotid   bruit or JVD  Back:    Spine nontender, no curvature, ROM normal, no CVA     tenderness  Lungs:     Clear to auscultation bilaterally without wheezes, rales or     ronchi; respirations unlabored  Chest Wall:    No tenderness or deformity   Heart:    Regular rate and rhythm, S1 and S2 normal, no murmur, rub   or gallop  Breast Exam:    Deferred to GYN  Abdomen:     Soft, non-tender, nondistended, normoactive bowel sounds,    no masses, no hepatosplenomegaly   Genitalia:    Deferred to GYN     Extremities:   No clubbing, cyanosis or edema  Pulses:   2+ and symmetric all extremities  Skin:   Skin color, texture, turgor normal, no rashes or lesions  Lymph nodes:   Cervical, supraclavicular, and axillary nodes normal  Neurologic:   CNII-XII intact, normal strength, sensation and gait; reflexes 2+ and symmetric throughout          Psych:   Normal mood, affect, hygiene and grooming.          Assessment & Plan:  Need for prophylactic vaccination and inoculation against influenza - Plan: Flu vaccine HIGH DOSE PF (Fluzone Tri High dose)  Chronic back pain - Plan: Ambulatory referral to Physical Therapy  ASHD (arteriosclerotic heart disease) - Plan: CBC with Differential, Comprehensive metabolic panel, Lipid panel, metoprolol succinate (TOPROL-XL) 25 MG 24 hr tablet  Essential hypertension - Plan: metoprolol succinate (TOPROL-XL) 25 MG 24 hr tablet, amLODipine (NORVASC) 5 MG tablet, valsartan-hydrochlorothiazide (DIOVAN-HCT) 160-12.5 MG per tablet  Hyperlipidemia with target LDL less than 70 - Plan: Lipid panel  Osteopenia  Encounter for long-term (current) use of other medications - Plan: CBC with Differential, Comprehensive metabolic panel, Lipid panel  we will set her up for physical  therapy for back rehabilitation program. Also recommend she stop Lipitor for week and see if it makes any difference in her pain since she has concerns about the statin drug. Continue other medications.

## 2013-10-08 DIAGNOSIS — S39012D Strain of muscle, fascia and tendon of lower back, subsequent encounter: Secondary | ICD-10-CM | POA: Diagnosis not present

## 2013-10-11 DIAGNOSIS — S39012D Strain of muscle, fascia and tendon of lower back, subsequent encounter: Secondary | ICD-10-CM | POA: Diagnosis not present

## 2013-10-15 DIAGNOSIS — S39012D Strain of muscle, fascia and tendon of lower back, subsequent encounter: Secondary | ICD-10-CM | POA: Diagnosis not present

## 2013-10-18 DIAGNOSIS — S39012D Strain of muscle, fascia and tendon of lower back, subsequent encounter: Secondary | ICD-10-CM | POA: Diagnosis not present

## 2013-10-22 DIAGNOSIS — S39012D Strain of muscle, fascia and tendon of lower back, subsequent encounter: Secondary | ICD-10-CM | POA: Diagnosis not present

## 2013-10-25 DIAGNOSIS — S39012D Strain of muscle, fascia and tendon of lower back, subsequent encounter: Secondary | ICD-10-CM | POA: Diagnosis not present

## 2013-11-05 DIAGNOSIS — S39012D Strain of muscle, fascia and tendon of lower back, subsequent encounter: Secondary | ICD-10-CM | POA: Diagnosis not present

## 2013-11-08 DIAGNOSIS — S39012D Strain of muscle, fascia and tendon of lower back, subsequent encounter: Secondary | ICD-10-CM | POA: Diagnosis not present

## 2013-11-12 DIAGNOSIS — S39012D Strain of muscle, fascia and tendon of lower back, subsequent encounter: Secondary | ICD-10-CM | POA: Diagnosis not present

## 2013-11-15 DIAGNOSIS — S39012D Strain of muscle, fascia and tendon of lower back, subsequent encounter: Secondary | ICD-10-CM | POA: Diagnosis not present

## 2013-11-20 DIAGNOSIS — S39012D Strain of muscle, fascia and tendon of lower back, subsequent encounter: Secondary | ICD-10-CM | POA: Diagnosis not present

## 2013-11-26 DIAGNOSIS — S39012D Strain of muscle, fascia and tendon of lower back, subsequent encounter: Secondary | ICD-10-CM | POA: Diagnosis not present

## 2013-11-27 DIAGNOSIS — S39012D Strain of muscle, fascia and tendon of lower back, subsequent encounter: Secondary | ICD-10-CM | POA: Diagnosis not present

## 2013-12-03 DIAGNOSIS — S39012D Strain of muscle, fascia and tendon of lower back, subsequent encounter: Secondary | ICD-10-CM | POA: Diagnosis not present

## 2013-12-04 DIAGNOSIS — S39012D Strain of muscle, fascia and tendon of lower back, subsequent encounter: Secondary | ICD-10-CM | POA: Diagnosis not present

## 2013-12-20 ENCOUNTER — Other Ambulatory Visit: Payer: Self-pay | Admitting: Family Medicine

## 2014-01-02 DIAGNOSIS — Z1231 Encounter for screening mammogram for malignant neoplasm of breast: Secondary | ICD-10-CM | POA: Diagnosis not present

## 2014-01-02 DIAGNOSIS — M8580 Other specified disorders of bone density and structure, unspecified site: Secondary | ICD-10-CM | POA: Diagnosis not present

## 2014-01-02 DIAGNOSIS — Z803 Family history of malignant neoplasm of breast: Secondary | ICD-10-CM | POA: Diagnosis not present

## 2014-01-02 DIAGNOSIS — Z8262 Family history of osteoporosis: Secondary | ICD-10-CM | POA: Diagnosis not present

## 2014-01-02 LAB — HM MAMMOGRAPHY: HM MAMMO: NEGATIVE

## 2014-01-02 LAB — HM DEXA SCAN

## 2014-01-07 ENCOUNTER — Encounter: Payer: Self-pay | Admitting: Family Medicine

## 2014-01-07 ENCOUNTER — Encounter: Payer: Self-pay | Admitting: Internal Medicine

## 2014-01-14 ENCOUNTER — Encounter: Payer: Self-pay | Admitting: Internal Medicine

## 2014-02-26 DIAGNOSIS — H40023 Open angle with borderline findings, high risk, bilateral: Secondary | ICD-10-CM | POA: Diagnosis not present

## 2014-02-26 DIAGNOSIS — H35033 Hypertensive retinopathy, bilateral: Secondary | ICD-10-CM | POA: Diagnosis not present

## 2014-02-26 DIAGNOSIS — H5213 Myopia, bilateral: Secondary | ICD-10-CM | POA: Diagnosis not present

## 2014-03-11 ENCOUNTER — Telehealth: Payer: Self-pay | Admitting: Family Medicine

## 2014-03-11 DIAGNOSIS — I2510 Atherosclerotic heart disease of native coronary artery without angina pectoris: Secondary | ICD-10-CM

## 2014-03-11 DIAGNOSIS — I10 Essential (primary) hypertension: Secondary | ICD-10-CM

## 2014-03-11 MED ORDER — OMEPRAZOLE 20 MG PO CPDR: 20.0000 mg | DELAYED_RELEASE_CAPSULE | Freq: Every day | ORAL | Status: DC

## 2014-03-11 MED ORDER — METOPROLOL SUCCINATE ER 25 MG PO TB24: ORAL_TABLET | ORAL | Status: DC

## 2014-03-11 MED ORDER — AMLODIPINE BESYLATE 5 MG PO TABS: ORAL_TABLET | ORAL | Status: DC

## 2014-03-11 MED ORDER — VALSARTAN-HYDROCHLOROTHIAZIDE 160-12.5 MG PO TABS: ORAL_TABLET | ORAL | Status: DC

## 2014-03-11 MED ORDER — SIMVASTATIN 20 MG PO TABS: 20.0000 mg | ORAL_TABLET | Freq: Every day | ORAL | Status: DC

## 2014-03-11 NOTE — Telephone Encounter (Signed)
She is intolerant of Lipitor. I will try her on Zocor and have her call me in one month

## 2014-03-11 NOTE — Telephone Encounter (Signed)
Called and left detailed message on her cell phone as she was not home when i called the house number

## 2014-03-11 NOTE — Telephone Encounter (Signed)
Pt needs refills to Metoprolol 25mg , Valsartan 160/12.5, Omeprazole 20mg . Amlodipine 5mg , 90 days [facility] to [facility].  She said she had taked to you about the Atorvastatin so she is now not taking it due to body aches, do you want to switch her to something else?

## 2014-03-11 NOTE — Telephone Encounter (Signed)
Tell her that I [facility] and a new cholesterol medicine to her local store. Have her call me in one month to let me know how she's doing on the new med

## 2014-04-11 ENCOUNTER — Telehealth: Payer: Self-pay | Admitting: Internal Medicine

## 2014-04-11 MED ORDER — SIMVASTATIN 20 MG PO TABS: 20.0000 mg | ORAL_TABLET | Freq: Every day | ORAL | Status: DC

## 2014-04-11 NOTE — Telephone Encounter (Signed)
Pt states that she is on simvastatin and doing fine on it. She takes it in the morning. She would like a [facility]

## 2014-05-30 ENCOUNTER — Telehealth: Payer: Self-pay | Admitting: Family Medicine

## 2014-05-30 NOTE — Telephone Encounter (Signed)
Have her switch to night time first and let me know

## 2014-05-30 NOTE — Telephone Encounter (Signed)
Pt called and stated that she wants to decrease her dose zocor. She states it is making her dizzy and nervous. She also states she is taking it in the morning not at night as recommended. She would like to decrease dose to 10 mg instead of 20 mg. She would like a refill for 10 mg [facility] Please advise pt on the dose change. Pt can be reached at [phone].

## 2014-05-30 NOTE — Telephone Encounter (Signed)
Patient has tried it at nighttime and it caused some sleep issues and nervousness/jittery feelings. Any other options?

## 2014-06-03 MED ORDER — ATORVASTATIN CALCIUM 10 MG PO TABS: 10.0000 mg | ORAL_TABLET | Freq: Every day | ORAL | Status: DC

## 2014-06-03 NOTE — Telephone Encounter (Signed)
Tell her that I called in Lipitor and stop the Zocor. Recheck in 2 months

## 2014-06-03 NOTE — Telephone Encounter (Signed)
I will switch to Lipitor and see how she tolerates this

## 2014-06-04 NOTE — Telephone Encounter (Signed)
I left a detailed message on the patients voicemail about the message from Dr. Redmond School.

## 2014-07-02 ENCOUNTER — Ambulatory Visit: Payer: Medicare Other | Admitting: Internal Medicine

## 2014-12-05 ENCOUNTER — Other Ambulatory Visit: Payer: Self-pay | Admitting: Family Medicine

## 2014-12-17 ENCOUNTER — Encounter: Payer: Self-pay | Admitting: Family Medicine

## 2014-12-17 ENCOUNTER — Ambulatory Visit (INDEPENDENT_AMBULATORY_CARE_PROVIDER_SITE_OTHER): Payer: Medicare Other | Admitting: Family Medicine

## 2014-12-17 VITALS — BP 124/86 | HR 72 | Temp 98.1°F | Ht 62.5 in | Wt 151.6 lb

## 2014-12-17 DIAGNOSIS — Z23 Encounter for immunization: Secondary | ICD-10-CM | POA: Diagnosis not present

## 2014-12-17 DIAGNOSIS — M8580 Other specified disorders of bone density and structure, unspecified site: Secondary | ICD-10-CM | POA: Diagnosis not present

## 2014-12-17 DIAGNOSIS — K219 Gastro-esophageal reflux disease without esophagitis: Secondary | ICD-10-CM

## 2014-12-17 DIAGNOSIS — H9113 Presbycusis, bilateral: Secondary | ICD-10-CM | POA: Diagnosis not present

## 2014-12-17 DIAGNOSIS — G8929 Other chronic pain: Secondary | ICD-10-CM | POA: Diagnosis not present

## 2014-12-17 DIAGNOSIS — E785 Hyperlipidemia, unspecified: Secondary | ICD-10-CM | POA: Diagnosis not present

## 2014-12-17 DIAGNOSIS — I10 Essential (primary) hypertension: Secondary | ICD-10-CM

## 2014-12-17 DIAGNOSIS — I2510 Atherosclerotic heart disease of native coronary artery without angina pectoris: Secondary | ICD-10-CM

## 2014-12-17 DIAGNOSIS — M549 Dorsalgia, unspecified: Secondary | ICD-10-CM | POA: Diagnosis not present

## 2014-12-17 LAB — LIPID PANEL
CHOL/HDL RATIO: 2.5 ratio (ref <=5.0)
CHOLESTEROL: 160 mg/dL (ref 125–200)
HDL: 65 mg/dL (ref >=46)
LDL Cholesterol: 68 mg/dL (ref <130)
Triglycerides: 134 mg/dL (ref <150)
VLDL: 27 mg/dL (ref <30)

## 2014-12-17 LAB — COMPREHENSIVE METABOLIC PANEL
ALK PHOS: 61 U/L (ref 33–130)
ALT: 16 U/L (ref 6–29)
AST: 20 U/L (ref 10–35)
Albumin: 4.1 g/dL (ref 3.6–5.1)
BUN: 14 mg/dL (ref 7–25)
CHLORIDE: 104 mmol/L (ref 98–110)
CO2: 25 mmol/L (ref 20–31)
Calcium: 9.3 mg/dL (ref 8.6–10.4)
Creat: 0.99 mg/dL — ABNORMAL HIGH (ref 0.60–0.93)
Glucose, Bld: 95 mg/dL (ref 65–99)
POTASSIUM: 4 mmol/L (ref 3.5–5.3)
Sodium: 139 mmol/L (ref 135–146)
TOTAL PROTEIN: 6.7 g/dL (ref 6.1–8.1)
Total Bilirubin: 0.5 mg/dL (ref 0.2–1.2)

## 2014-12-17 LAB — CBC WITH DIFFERENTIAL/PLATELET
Basophils Absolute: 0 10*3/uL (ref 0.0–0.1)
Basophils Relative: 1 % (ref 0–1)
Eosinophils Absolute: 0.1 10*3/uL (ref 0.0–0.7)
Eosinophils Relative: 3 % (ref 0–5)
HCT: 43.9 % (ref 36.0–46.0)
HEMOGLOBIN: 14.8 g/dL (ref 12.0–15.0)
LYMPHS ABS: 1.3 10*3/uL (ref 0.7–4.0)
Lymphocytes Relative: 29 % (ref 12–46)
MCH: 28.5 pg (ref 26.0–34.0)
MCHC: 33.7 g/dL (ref 30.0–36.0)
MCV: 84.6 fL (ref 78.0–100.0)
MPV: 9.4 fL (ref 8.6–12.4)
Monocytes Absolute: 0.4 10*3/uL (ref 0.1–1.0)
Monocytes Relative: 9 % (ref 3–12)
NEUTROS ABS: 2.7 10*3/uL (ref 1.7–7.7)
NEUTROS PCT: 58 % (ref 43–77)
Platelets: 228 10*3/uL (ref 150–400)
RBC: 5.19 MIL/uL — ABNORMAL HIGH (ref 3.87–5.11)
RDW: 14 % (ref 11.5–15.5)
WBC: 4.6 10*3/uL (ref 4.0–10.5)

## 2014-12-17 NOTE — Progress Notes (Signed)
Subjective:   Tanya Berry is a 79 y.o. female who presents for Medicare Annual (Subsequent) preventive examination. She does have underlying reflux disease and is using Prilosec intermittently. She also is had some difficulty with incontinence but usually only once per week and when she has a full bladder. She has had a colonoscopy. She does wear hearing aids. She's had no difficulty with chest pain, shortness of breath, DOE. She does have a previous history of stent 2002. She's had no nausea, vomiting, allergy related symptoms. Family and social history as well as health maintenance and immunizations were reviewed  Review of Systems:Cardiac Risk Factors include: advanced age (>3men, >69 women);dyslipidemia otherwise negative     Objective:     Vitals: BP 124/86 mmHg  Pulse 72  Temp(Src) 98.1 F (36.7 C) (Oral)  Ht 5' 2.5" (1.588 m)  Wt 151 lb 9.6 oz (68.765 kg)  BMI 27.27 kg/m2 Alert and in no distress. Tympanic membranes and canals are normal. Pharyngeal area is normal. Neck is supple without adenopathy or thyromegaly. Cardiac exam shows a regular sinus rhythm without murmurs or gallops. Lungs are clear to auscultation.  Tobacco History  Smoking status  . Never Smoker   Smokeless tobacco  . Never Used       Past Medical History  Diagnosis Date  . Allergy seasonal rhinitis  . Menopause   . GERD (gastroesophageal reflux disease)   . Hypertension   . ASHD (arteriosclerotic heart disease) 2002    angioplasty  . Dyslipidemia   . Hemorrhoid   . Diverticulosis   . Osteopenia   . Hearing loss, sensorineural, high frequency    Past Surgical History  Procedure Laterality Date  . Colonoscopy  2003   No family history on file. History  Sexual Activity  . Sexual Activity: Not Currently    Outpatient Encounter Prescriptions as of 12/17/2014  Medication Sig  . amLODipine (NORVASC) 5 MG tablet TAKE 1 TABLET DAILY  . aspirin 81 MG EC tablet Take 81 mg by mouth daily.      Marland Kitchen atorvastatin (LIPITOR) 10 MG tablet Take 1 tablet (10 mg total) by mouth daily.  . calcium carbonate 200 MG capsule Take 250 mg by mouth 2 (two) times daily with a meal.  . cholecalciferol (VITAMIN D) 1000 UNITS tablet Take 1,000 Units by mouth daily.  . metoprolol succinate (TOPROL-XL) 25 MG 24 hr tablet 1 daily  . Multiple Vitamins-Minerals (OSTEO COMPLEX PO) Take 1 tablet by mouth 1 dose over 46 hours.    Marland Kitchen omeprazole (PRILOSEC) 20 MG capsule Take 1 capsule (20 mg total) by mouth daily.  . valsartan-hydrochlorothiazide (DIOVAN-HCT) 160-12.5 MG tablet TAKE 1 TABLET DAILY (DUE FOR MED CHECK, PLEASE CALL FOR APPOINTMENT)   No facility-administered encounter medications on file as of 12/17/2014.    Activities of Daily Living normal  Patient Care Team: Denita Lung, MD as PCP - General    Assessment:    ASHD (arteriosclerotic heart disease)  Essential hypertension - Plan: CBC with Differential/Platelet, Comprehensive metabolic panel  Chronic back pain  Hyperlipidemia with target LDL less than 70 - Plan: Lipid panel  Osteopenia  Need for prophylactic vaccination and inoculation against influenza - Plan: Flu vaccine HIGH DOSE PF (Fluzone High dose)  Gastroesophageal reflux disease without esophagitis  Presbycusis of both ears   Exercise Activities and Dietary recommendations Current Exercise Habits:: Home exercise routine, Type of exercise: stretching;walking, Time (Minutes): 20, Frequency (Times/Week): 4, Weekly Exercise (Minutes/Week): 80, Intensity: Moderate  Goals  None     Fall Risk Fall Risk  12/17/2014 10/01/2013 09/26/2012  Falls in the past year? No No No   Depression Screen PHQ 2/9 Scores 12/17/2014 10/01/2013 09/26/2012 08/05/2011  PHQ - 2 Score 0 0 0 0     Cognitive Testing No flowsheet data found.  Immunization History  Administered Date(s) Administered  . DTaP 03/22/2008  . Influenza Split 10/27/2010, 10/27/2011  . Influenza Whole 09/23/2009  .  Influenza, High Dose Seasonal PF 10/01/2013, 12/17/2014  . Influenza,inj,Quad PF,36+ Mos 09/26/2012  . Pneumococcal Conjugate-13 10/19/2004  . Pneumococcal Polysaccharide-23 09/26/2012  . Td 05/20/1997  . Zoster 01/04/2009   Screening Tests Health Maintenance  Topic Date Due  . TETANUS/TDAP  05/21/2007  . INFLUENZA VACCINE  08/05/2014  . DEXA SCAN  Completed  . ZOSTAVAX  Completed  . PNA vac Low Risk Adult  Completed      Plan:    During the course of the visit the patient was educated and counseled about the following appropriate screening and preventive services:   Vaccines to include Pneumoccal, Influenza, Hepatitis B, Td, Zostavax, HCV  Electrocardiogram  Cardiovascular Disease  Colorectal cancer screening  Bone density screening  Diabetes screening  Glaucoma screening  Mammography/PAP  Nutrition counseling   Patient Instructions (the written plan) was given to the patient.  overall she is doing quite well and having no difficulties.  Wyatt Haste, MD  12/17/2014

## 2014-12-17 NOTE — Patient Instructions (Addendum)
  Ms. Coxwell , Thank you for taking time to come for your Medicare Wellness Visit. I appreciate your ongoing commitment to your health goals. Please review the following plan we discussed and let me know if I can assist you in the future.   These are the goals we discussed: Goals    None      This is a list of the screening recommended for you and due dates:  Health Maintenance  Topic Date Due  . Tetanus Vaccine  05/21/2007  . Flu Shot  08/05/2014  . DEXA scan (bone density measurement)  Completed  . Shingles Vaccine  Completed  . Pneumonia vaccines  Completed    Encouraged her to continue with her present lifestyle. Overall she is doing quite well.

## 2014-12-21 ENCOUNTER — Other Ambulatory Visit: Payer: Self-pay | Admitting: Family Medicine

## 2014-12-28 ENCOUNTER — Other Ambulatory Visit: Payer: Self-pay | Admitting: Family Medicine

## 2015-01-29 ENCOUNTER — Other Ambulatory Visit: Payer: Self-pay | Admitting: Family Medicine

## 2015-05-11 ENCOUNTER — Other Ambulatory Visit: Payer: Self-pay | Admitting: Family Medicine

## 2015-06-01 ENCOUNTER — Other Ambulatory Visit: Payer: Self-pay | Admitting: Family Medicine

## 2015-06-21 ENCOUNTER — Other Ambulatory Visit: Payer: Self-pay | Admitting: Family Medicine

## 2015-06-29 ENCOUNTER — Other Ambulatory Visit: Payer: Self-pay | Admitting: Family Medicine

## 2015-07-28 ENCOUNTER — Other Ambulatory Visit: Payer: Self-pay | Admitting: Family Medicine

## 2015-09-01 DIAGNOSIS — H353130 Nonexudative age-related macular degeneration, bilateral, stage unspecified: Secondary | ICD-10-CM | POA: Diagnosis not present

## 2015-09-01 DIAGNOSIS — H5213 Myopia, bilateral: Secondary | ICD-10-CM | POA: Diagnosis not present

## 2015-09-01 DIAGNOSIS — H353131 Nonexudative age-related macular degeneration, bilateral, early dry stage: Secondary | ICD-10-CM | POA: Diagnosis not present

## 2015-09-01 DIAGNOSIS — Z961 Presence of intraocular lens: Secondary | ICD-10-CM | POA: Diagnosis not present

## 2015-10-26 ENCOUNTER — Other Ambulatory Visit: Payer: Self-pay | Admitting: Family Medicine

## 2015-10-27 NOTE — Telephone Encounter (Signed)
Is this okay to refill? 

## 2015-11-11 ENCOUNTER — Other Ambulatory Visit (INDEPENDENT_AMBULATORY_CARE_PROVIDER_SITE_OTHER): Payer: Medicare Other

## 2015-11-11 DIAGNOSIS — Z23 Encounter for immunization: Secondary | ICD-10-CM

## 2015-11-30 ENCOUNTER — Other Ambulatory Visit: Payer: Self-pay | Admitting: Family Medicine

## 2015-12-23 ENCOUNTER — Ambulatory Visit (INDEPENDENT_AMBULATORY_CARE_PROVIDER_SITE_OTHER): Payer: Medicare Other | Admitting: Family Medicine

## 2015-12-23 ENCOUNTER — Encounter: Payer: Self-pay | Admitting: Family Medicine

## 2015-12-23 VITALS — BP 114/80 | HR 68 | Resp 12 | Ht 63.5 in | Wt 149.0 lb

## 2015-12-23 DIAGNOSIS — I10 Essential (primary) hypertension: Secondary | ICD-10-CM

## 2015-12-23 DIAGNOSIS — M8580 Other specified disorders of bone density and structure, unspecified site: Secondary | ICD-10-CM | POA: Diagnosis not present

## 2015-12-23 DIAGNOSIS — K219 Gastro-esophageal reflux disease without esophagitis: Secondary | ICD-10-CM | POA: Diagnosis not present

## 2015-12-23 DIAGNOSIS — M545 Low back pain, unspecified: Secondary | ICD-10-CM

## 2015-12-23 DIAGNOSIS — E785 Hyperlipidemia, unspecified: Secondary | ICD-10-CM

## 2015-12-23 DIAGNOSIS — I2510 Atherosclerotic heart disease of native coronary artery without angina pectoris: Secondary | ICD-10-CM | POA: Diagnosis not present

## 2015-12-23 DIAGNOSIS — G8929 Other chronic pain: Secondary | ICD-10-CM

## 2015-12-23 LAB — CBC WITH DIFFERENTIAL/PLATELET
BASOS PCT: 1 %
Basophils Absolute: 47 cells/uL (ref 0–200)
EOS PCT: 3 %
Eosinophils Absolute: 141 cells/uL (ref 15–500)
HCT: 44.2 % (ref 35.0–45.0)
Hemoglobin: 14.9 g/dL (ref 11.7–15.5)
LYMPHS PCT: 30 %
Lymphs Abs: 1410 cells/uL (ref 850–3900)
MCH: 28.7 pg (ref 27.0–33.0)
MCHC: 33.7 g/dL (ref 32.0–36.0)
MCV: 85 fL (ref 80.0–100.0)
MONOS PCT: 11 %
MPV: 9.8 fL (ref 7.5–12.5)
Monocytes Absolute: 517 cells/uL (ref 200–950)
Neutro Abs: 2585 cells/uL (ref 1500–7800)
Neutrophils Relative %: 55 %
PLATELETS: 244 10*3/uL (ref 140–400)
RBC: 5.2 MIL/uL — AB (ref 3.80–5.10)
RDW: 14 % (ref 11.0–15.0)
WBC: 4.7 10*3/uL (ref 4.0–10.5)

## 2015-12-23 MED ORDER — ATORVASTATIN CALCIUM 10 MG PO TABS: 10.0000 mg | ORAL_TABLET | Freq: Every day | ORAL | 3 refills | Status: DC

## 2015-12-23 MED ORDER — AMLODIPINE BESYLATE 5 MG PO TABS: 5.0000 mg | ORAL_TABLET | Freq: Every day | ORAL | 3 refills | Status: DC

## 2015-12-23 MED ORDER — VALSARTAN-HYDROCHLOROTHIAZIDE 160-12.5 MG PO TABS: ORAL_TABLET | ORAL | 3 refills | Status: DC

## 2015-12-23 NOTE — Patient Instructions (Signed)
  Tanya Berry , Thank you for taking time to come for your Medicare Wellness Visit. I appreciate your ongoing commitment to your health goals. Please review the following plan we discussed and let me know if I can assist you in the future.   These are the goals we discussed: Continue on present medications and I will center physical therapy since it has helped in the past.  This is a list of the screening recommended for you and due dates:  Health Maintenance  Topic Date Due  . Tetanus Vaccine  05/21/2007  . Flu Shot  Completed  . DEXA scan (bone density measurement)  Completed  . Shingles Vaccine  Completed  . Pneumonia vaccines  Completed

## 2015-12-23 NOTE — Progress Notes (Signed)
Subjective:   HPI  Tanya Berry is a 80 y.o. female who presents for a medication check appointment and wellness exam her main complaint today is continued difficulty with low back pain that she has had for several years. She notes that in the past she did get physical therapy on which really helped. She states she is doing her range of motion type exercises. No numbness, tingling or weakness. The pain is worse with physical activities but it does not keep her from functioning. She continues on her Prilosec for her reflux. She also is taking Lipitor and an every other day basis. She continues on her dietary supplements as well as blood pressure medications and is having no difficulty with them. Review of the record indicates she is up-to-date on immunizations. She will plan to get another mammogram.  Medical care team includes:     Preventative care: Last ophthalmology visit: 11/05/15 Last dental visit: 02/03/16 Last colonoscopy:2004 Dr.Mann Last mammogram:01/02/2014 Last gynecological exam: 06/02/06 Last EKG:09/26/12 Last labs:12/17/14  Prior vaccinations: TD or Tdap: 05/20/1997 Influenza: 11/11/15 Pneumococcal:09/26/12 13: 10/19/04 Shingles/Zostavax:01/04/09   Advanced directive: Yes  Reviewed their medical, surgical, family, social, medication, and allergy history and updated chart as appropriate.    Review of Systems Negative except as above   Objective:   Physical Exam   General appearance: alert, no distress, WD/WN,  Skin: Normal HEENT: normocephalic, conjunctiva/corneas normal, sclerae anicteric, PERRLA, EOMi, nares patent, no discharge or erythema, pharynx normal Oral cavity: MMM, tongue normal, teeth normal Neck: supple, no lymphadenopathy, no thyromegaly, no masses, normal ROM Chest: non tender, normal shape and expansion Heart: RRR, normal S1, S2, no murmurs Lungs: CTA bilaterally, no wheezes, rhonchi, or rales Abdomen: +bs, soft, non tender, non distended, no masses,  no hepatomegaly, no splenomegaly, no bruits Back: non tender, normal ROM, slight scoliosis is noted. Musculoskeletal: upper extremities non tender, no obvious deformity, normal ROM throughout, lower extremities non tender, no obvious deformity, normal ROM throughout Extremities: no edema, no cyanosis, no clubbing Pulses: 2+ symmetric, upper and lower extremities, normal cap refill Neurological: alert, oriented x 3, CN2-12 intact, strength normal upper extremities and lower extremities, sensation normal throughout, DTRs 2+ throughout, no cerebellar signs, gait normal Psychiatric: normal affect, behavior normal, pleasant    Assessment and Plan :   Chronic low back pain without sciatica, unspecified back pain laterality - Plan: Ambulatory referral to Physical Therapy  Essential hypertension - Plan: CBC with Differential/Platelet, Comprehensive metabolic panel, valsartan-hydrochlorothiazide (DIOVAN-HCT) 160-12.5 MG tablet, amLODipine (NORVASC) 5 MG tablet  ASHD (arteriosclerotic heart disease) - Plan: CBC with Differential/Platelet, Comprehensive metabolic panel  Hyperlipidemia with target LDL less than 70 - Plan: Lipid panel, atorvastatin (LIPITOR) 10 MG tablet  Osteopenia, unspecified location  Gastroesophageal reflux disease without esophagitis Her last DEXA scan was 2015. Explained that I did not think she needed another one at the present time. We'll refer for physical therapy since it has helped in the past.    Physical exam - discussed healthy lifestyle, diet, exercise, preventative care, vaccinations, and addressed their concerns.   Follow-up as needed

## 2015-12-24 LAB — COMPREHENSIVE METABOLIC PANEL
ALK PHOS: 53 U/L (ref 33–130)
ALT: 15 U/L (ref 6–29)
AST: 21 U/L (ref 10–35)
Albumin: 4.5 g/dL (ref 3.6–5.1)
BUN: 16 mg/dL (ref 7–25)
CO2: 24 mmol/L (ref 20–31)
Calcium: 9.2 mg/dL (ref 8.6–10.4)
Chloride: 104 mmol/L (ref 98–110)
Creat: 1.2 mg/dL — ABNORMAL HIGH (ref 0.60–0.93)
GLUCOSE: 105 mg/dL — AB (ref 65–99)
POTASSIUM: 4.2 mmol/L (ref 3.5–5.3)
Sodium: 141 mmol/L (ref 135–146)
Total Bilirubin: 0.5 mg/dL (ref 0.2–1.2)
Total Protein: 7.4 g/dL (ref 6.1–8.1)

## 2015-12-24 LAB — LIPID PANEL
CHOL/HDL RATIO: 2.5 ratio (ref <5.0)
Cholesterol: 178 mg/dL (ref <200)
HDL: 72 mg/dL (ref >50)
LDL Cholesterol: 89 mg/dL (ref <100)
Triglycerides: 83 mg/dL (ref <150)
VLDL: 17 mg/dL (ref <30)

## 2016-01-09 DIAGNOSIS — R2689 Other abnormalities of gait and mobility: Secondary | ICD-10-CM | POA: Diagnosis not present

## 2016-01-09 DIAGNOSIS — M545 Low back pain: Secondary | ICD-10-CM | POA: Diagnosis not present

## 2016-01-14 DIAGNOSIS — M545 Low back pain: Secondary | ICD-10-CM | POA: Diagnosis not present

## 2016-01-14 DIAGNOSIS — R2689 Other abnormalities of gait and mobility: Secondary | ICD-10-CM | POA: Diagnosis not present

## 2016-01-16 DIAGNOSIS — M545 Low back pain: Secondary | ICD-10-CM | POA: Diagnosis not present

## 2016-01-16 DIAGNOSIS — R2689 Other abnormalities of gait and mobility: Secondary | ICD-10-CM | POA: Diagnosis not present

## 2016-01-28 DIAGNOSIS — M545 Low back pain: Secondary | ICD-10-CM | POA: Diagnosis not present

## 2016-01-28 DIAGNOSIS — R2689 Other abnormalities of gait and mobility: Secondary | ICD-10-CM | POA: Diagnosis not present

## 2016-01-30 DIAGNOSIS — M545 Low back pain: Secondary | ICD-10-CM | POA: Diagnosis not present

## 2016-01-30 DIAGNOSIS — R2689 Other abnormalities of gait and mobility: Secondary | ICD-10-CM | POA: Diagnosis not present

## 2016-02-02 DIAGNOSIS — H6123 Impacted cerumen, bilateral: Secondary | ICD-10-CM | POA: Diagnosis not present

## 2016-02-02 DIAGNOSIS — Z974 Presence of external hearing-aid: Secondary | ICD-10-CM | POA: Diagnosis not present

## 2016-02-02 DIAGNOSIS — Z87891 Personal history of nicotine dependence: Secondary | ICD-10-CM | POA: Diagnosis not present

## 2016-02-02 DIAGNOSIS — H9193 Unspecified hearing loss, bilateral: Secondary | ICD-10-CM | POA: Diagnosis not present

## 2016-02-04 DIAGNOSIS — R2689 Other abnormalities of gait and mobility: Secondary | ICD-10-CM | POA: Diagnosis not present

## 2016-02-04 DIAGNOSIS — M545 Low back pain: Secondary | ICD-10-CM | POA: Diagnosis not present

## 2016-02-06 DIAGNOSIS — R2689 Other abnormalities of gait and mobility: Secondary | ICD-10-CM | POA: Diagnosis not present

## 2016-02-06 DIAGNOSIS — M545 Low back pain: Secondary | ICD-10-CM | POA: Diagnosis not present

## 2016-02-10 DIAGNOSIS — H903 Sensorineural hearing loss, bilateral: Secondary | ICD-10-CM | POA: Diagnosis not present

## 2016-02-11 DIAGNOSIS — M545 Low back pain: Secondary | ICD-10-CM | POA: Diagnosis not present

## 2016-02-11 DIAGNOSIS — R2689 Other abnormalities of gait and mobility: Secondary | ICD-10-CM | POA: Diagnosis not present

## 2016-02-13 DIAGNOSIS — M545 Low back pain: Secondary | ICD-10-CM | POA: Diagnosis not present

## 2016-02-13 DIAGNOSIS — R2689 Other abnormalities of gait and mobility: Secondary | ICD-10-CM | POA: Diagnosis not present

## 2016-02-18 DIAGNOSIS — R2689 Other abnormalities of gait and mobility: Secondary | ICD-10-CM | POA: Diagnosis not present

## 2016-02-18 DIAGNOSIS — M545 Low back pain: Secondary | ICD-10-CM | POA: Diagnosis not present

## 2016-02-20 DIAGNOSIS — R2689 Other abnormalities of gait and mobility: Secondary | ICD-10-CM | POA: Diagnosis not present

## 2016-02-20 DIAGNOSIS — M545 Low back pain: Secondary | ICD-10-CM | POA: Diagnosis not present

## 2016-02-25 DIAGNOSIS — R2689 Other abnormalities of gait and mobility: Secondary | ICD-10-CM | POA: Diagnosis not present

## 2016-02-25 DIAGNOSIS — M545 Low back pain: Secondary | ICD-10-CM | POA: Diagnosis not present

## 2016-02-27 DIAGNOSIS — M545 Low back pain: Secondary | ICD-10-CM | POA: Diagnosis not present

## 2016-02-27 DIAGNOSIS — R2689 Other abnormalities of gait and mobility: Secondary | ICD-10-CM | POA: Diagnosis not present

## 2016-03-03 DIAGNOSIS — R2689 Other abnormalities of gait and mobility: Secondary | ICD-10-CM | POA: Diagnosis not present

## 2016-03-03 DIAGNOSIS — M545 Low back pain: Secondary | ICD-10-CM | POA: Diagnosis not present

## 2016-06-28 ENCOUNTER — Ambulatory Visit: Payer: Medicare Other | Admitting: Medical

## 2016-07-05 ENCOUNTER — Encounter: Payer: Self-pay | Admitting: Family Medicine

## 2016-07-05 ENCOUNTER — Ambulatory Visit (INDEPENDENT_AMBULATORY_CARE_PROVIDER_SITE_OTHER): Payer: Medicare Other | Admitting: Family Medicine

## 2016-07-05 ENCOUNTER — Ambulatory Visit
Admission: RE | Admit: 2016-07-05 | Discharge: 2016-07-05 | Disposition: A | Discharge status: Home / Self Care | Payer: Medicare Other | Source: Ambulatory Visit | Attending: Family Medicine | Admitting: Family Medicine

## 2016-07-05 VITALS — BP 120/74 | HR 72 | Wt 149.0 lb

## 2016-07-05 DIAGNOSIS — M25552 Pain in left hip: Secondary | ICD-10-CM

## 2016-07-05 DIAGNOSIS — M4186 Other forms of scoliosis, lumbar region: Secondary | ICD-10-CM | POA: Diagnosis not present

## 2016-07-05 NOTE — Progress Notes (Signed)
   Subjective:    Patient ID: Tanya Berry, female    DOB: Oct 22, 1935, 81 y.o.   MRN: [id number]  HPI She complains of left hip discomfort for the last 2 weeks. It is worse when she moves. No history of injury. No other bony abnormalities noted. Is not remembered difficulty with this in the past. No numbness, tingling or weakness. She does have a previous history of chronic back pain but states this is different.  Review of Systems     Objective:   Physical Exam She points to the area of the left iliac crest is to where her pain is however there is no tenderness there. There is minimal discomfort over the left SI joint. Full motion of the hips without pain. Corky Sox and stork test negative. Negative straight leg raising.       Assessment & Plan:  Left hip pain - Plan: DG Lumbar Spine Complete The x-ray is negative. Will recommend physical therapy and NSAID of choice for pain relief.

## 2016-08-03 ENCOUNTER — Telehealth: Payer: Self-pay | Admitting: Family Medicine

## 2016-08-03 MED ORDER — LOSARTAN POTASSIUM-HCTZ 50-12.5 MG PO TABS: 1.0000 | ORAL_TABLET | Freq: Every day | ORAL | 1 refills | Status: DC

## 2016-08-03 NOTE — Telephone Encounter (Signed)
Have her come in after she's been on the new medicine for about a month for a blood pressure recheck

## 2016-08-03 NOTE — Telephone Encounter (Signed)
Pt informed and verbalized understanding

## 2016-08-03 NOTE — Telephone Encounter (Signed)
Pt called and states that her valsartan-hydrochlorothiazide is one of the rx that is recalled she would like to be switched to something is she uses [place], [place] and would like to be called and let known when something is called in different she can be reached at [phone]

## 2016-09-23 ENCOUNTER — Other Ambulatory Visit: Payer: Self-pay | Admitting: Family Medicine

## 2016-10-05 ENCOUNTER — Telehealth: Payer: Self-pay | Admitting: Internal Medicine

## 2016-10-05 ENCOUNTER — Other Ambulatory Visit (INDEPENDENT_AMBULATORY_CARE_PROVIDER_SITE_OTHER): Payer: Medicare Other

## 2016-10-05 VITALS — BP 150/90

## 2016-10-05 DIAGNOSIS — Z23 Encounter for immunization: Secondary | ICD-10-CM | POA: Diagnosis not present

## 2016-10-05 NOTE — Telephone Encounter (Signed)
Called pt she will come back in one month.

## 2016-10-05 NOTE — Telephone Encounter (Signed)
Have her come back for recheck in one month

## 2016-10-05 NOTE — Telephone Encounter (Signed)
Pt came in for a blood pressure check today and it was 150/90. She is taking losartan/hctz 50/12.5mg . Half tablet in the am and half tablet in the pm. She is has not been checking her blood pressures at home. She has been going through a lot recently due to a recent death in the family

## 2016-10-21 ENCOUNTER — Other Ambulatory Visit: Payer: Self-pay | Admitting: Family Medicine

## 2016-10-21 NOTE — Telephone Encounter (Signed)
Pt due for f/u 11/2016, 30 day sent

## 2016-12-09 ENCOUNTER — Other Ambulatory Visit: Payer: Self-pay

## 2016-12-17 ENCOUNTER — Other Ambulatory Visit: Payer: Self-pay | Admitting: Family Medicine

## 2016-12-17 DIAGNOSIS — I10 Essential (primary) hypertension: Secondary | ICD-10-CM

## 2016-12-22 ENCOUNTER — Other Ambulatory Visit: Payer: Self-pay | Admitting: Family Medicine

## 2017-01-08 ENCOUNTER — Other Ambulatory Visit: Payer: Self-pay | Admitting: Family Medicine

## 2017-01-08 DIAGNOSIS — E785 Hyperlipidemia, unspecified: Secondary | ICD-10-CM

## 2017-01-10 NOTE — Telephone Encounter (Signed)
Pt scheduled appt for 03/08/2017. Tanya Berry

## 2017-01-12 ENCOUNTER — Other Ambulatory Visit: Payer: Self-pay | Admitting: Family Medicine

## 2017-03-08 ENCOUNTER — Ambulatory Visit: Payer: Medicare Other | Admitting: Family Medicine

## 2017-03-14 ENCOUNTER — Other Ambulatory Visit: Payer: Self-pay | Admitting: Family Medicine

## 2017-03-16 ENCOUNTER — Other Ambulatory Visit: Payer: Self-pay

## 2017-03-16 ENCOUNTER — Telehealth: Payer: Self-pay | Admitting: Family Medicine

## 2017-03-16 MED ORDER — METOPROLOL SUCCINATE ER 25 MG PO TB24: 25.0000 mg | ORAL_TABLET | Freq: Every day | ORAL | 0 refills | Status: DC

## 2017-03-16 NOTE — Telephone Encounter (Signed)
Called pt back to let her know that we need to schedule a med check or a CPE. Pt has not had either in a while. Let her know that's why we only sent in a thirty day supply. Waiting on a call back. [place]

## 2017-03-16 NOTE — Telephone Encounter (Signed)
Called pt back and let her know that her med was sent in on her behalf. Thanks [employer]

## 2017-03-16 NOTE — Telephone Encounter (Signed)
Pt called stating that she would like to get a 3 month supply of Toprol 25 mg sent to [facility]. Also pt took her last pill today and want to know if we have samples she can get today.

## 2017-04-13 DIAGNOSIS — H5203 Hypermetropia, bilateral: Secondary | ICD-10-CM | POA: Diagnosis not present

## 2017-04-13 DIAGNOSIS — H353131 Nonexudative age-related macular degeneration, bilateral, early dry stage: Secondary | ICD-10-CM | POA: Diagnosis not present

## 2017-04-13 DIAGNOSIS — H40021 Open angle with borderline findings, high risk, right eye: Secondary | ICD-10-CM | POA: Diagnosis not present

## 2017-05-10 ENCOUNTER — Encounter: Payer: Self-pay | Admitting: Family Medicine

## 2017-05-10 ENCOUNTER — Ambulatory Visit (INDEPENDENT_AMBULATORY_CARE_PROVIDER_SITE_OTHER): Payer: Medicare Other | Admitting: Family Medicine

## 2017-05-10 VITALS — BP 148/84 | HR 73 | Temp 97.8°F | Ht 63.0 in | Wt 148.6 lb

## 2017-05-10 DIAGNOSIS — G8929 Other chronic pain: Secondary | ICD-10-CM | POA: Diagnosis not present

## 2017-05-10 DIAGNOSIS — I2510 Atherosclerotic heart disease of native coronary artery without angina pectoris: Secondary | ICD-10-CM

## 2017-05-10 DIAGNOSIS — H9113 Presbycusis, bilateral: Secondary | ICD-10-CM | POA: Diagnosis not present

## 2017-05-10 DIAGNOSIS — I10 Essential (primary) hypertension: Secondary | ICD-10-CM

## 2017-05-10 DIAGNOSIS — M545 Low back pain, unspecified: Secondary | ICD-10-CM

## 2017-05-10 DIAGNOSIS — K219 Gastro-esophageal reflux disease without esophagitis: Secondary | ICD-10-CM | POA: Diagnosis not present

## 2017-05-10 DIAGNOSIS — E785 Hyperlipidemia, unspecified: Secondary | ICD-10-CM | POA: Diagnosis not present

## 2017-05-10 LAB — CBC WITH DIFFERENTIAL/PLATELET
BASOS ABS: 0 10*3/uL (ref 0.0–0.2)
Basos: 1 %
EOS (ABSOLUTE): 0.1 10*3/uL (ref 0.0–0.4)
Eos: 2 %
HEMOGLOBIN: 15.1 g/dL (ref 11.1–15.9)
Hematocrit: 43.6 % (ref 34.0–46.6)
IMMATURE GRANULOCYTES: 0 %
Immature Grans (Abs): 0 10*3/uL (ref 0.0–0.1)
LYMPHS ABS: 1.3 10*3/uL (ref 0.7–3.1)
Lymphs: 30 %
MCH: 29.2 pg (ref 26.6–33.0)
MCHC: 34.6 g/dL (ref 31.5–35.7)
MCV: 84 fL (ref 79–97)
MONOCYTES: 9 %
MONOS ABS: 0.4 10*3/uL (ref 0.1–0.9)
NEUTROS PCT: 58 %
Neutrophils Absolute: 2.4 10*3/uL (ref 1.4–7.0)
Platelets: 217 10*3/uL (ref 150–379)
RBC: 5.18 x10E6/uL (ref 3.77–5.28)
RDW: 14.1 % (ref 12.3–15.4)
WBC: 4.1 10*3/uL (ref 3.4–10.8)

## 2017-05-10 LAB — COMPREHENSIVE METABOLIC PANEL
ALK PHOS: 72 IU/L (ref 39–117)
ALT: 14 IU/L (ref 0–32)
AST: 21 IU/L (ref 0–40)
Albumin/Globulin Ratio: 1.8 (ref 1.2–2.2)
Albumin: 4.6 g/dL (ref 3.5–4.7)
BUN / CREAT RATIO: 12 (ref 12–28)
BUN: 14 mg/dL (ref 8–27)
Bilirubin Total: 0.4 mg/dL (ref 0.0–1.2)
CHLORIDE: 103 mmol/L (ref 96–106)
CO2: 25 mmol/L (ref 20–29)
Calcium: 9.4 mg/dL (ref 8.7–10.3)
Creatinine, Ser: 1.13 mg/dL — ABNORMAL HIGH (ref 0.57–1.00)
GFR calc non Af Amer: 46 mL/min/{1.73_m2} — ABNORMAL LOW (ref >59)
GFR, EST AFRICAN AMERICAN: 53 mL/min/{1.73_m2} — AB (ref >59)
GLUCOSE: 102 mg/dL — AB (ref 65–99)
Globulin, Total: 2.5 g/dL (ref 1.5–4.5)
Potassium: 4.5 mmol/L (ref 3.5–5.2)
SODIUM: 140 mmol/L (ref 134–144)
Total Protein: 7.1 g/dL (ref 6.0–8.5)

## 2017-05-10 LAB — LIPID PANEL
CHOLESTEROL TOTAL: 193 mg/dL (ref 100–199)
Chol/HDL Ratio: 2.6 ratio (ref 0.0–4.4)
HDL: 75 mg/dL (ref >39)
LDL Calculated: 94 mg/dL (ref 0–99)
Triglycerides: 122 mg/dL (ref 0–149)
VLDL Cholesterol Cal: 24 mg/dL (ref 5–40)

## 2017-05-10 NOTE — Progress Notes (Signed)
Tanya Berry is a 82 y.o. female who presents for annual wellness visit and follow-up on chronic medical conditions.  She continues on her blood pressure medications and is having no difficulty with them.  She is also taking atorvastatin every other day.  She states that she is still having reflux.  Presently she is taking a 20 mg pill for that.  She does use a hearing aid but lost 1 of them because she stepped on it.  She does have some occasional low back pain.  She has had no difficulty with chest pain, shortness of breath or PND.  Immunizations and Health Maintenance Immunization History  Administered Date(s) Administered  . DTaP 03/22/2008  . Influenza Split 10/27/2010, 10/27/2011  . Influenza Whole 09/23/2009  . Influenza, High Dose Seasonal PF 10/01/2013, 12/17/2014, 11/11/2015, 10/05/2016  . Influenza,inj,Quad PF,6+ Mos 09/26/2012  . Pneumococcal Conjugate-13 10/19/2004  . Pneumococcal Polysaccharide-23 09/26/2012  . Td 05/20/1997  . Zoster 01/04/2009   Health Maintenance Due  Topic Date Due  . TETANUS/TDAP  05/21/2007    Last Pap smear:N/A Last mammogram:N/A Last colonoscopy:N/A Last DEXA: Dentist:? Ophtho:? Exercise:minimal  Other doctors caring for patient include:--  Advanced directives: Yes.  Copy in chart.    Depression screen:  See questionnaire below.  Depression screen [facility] 2/9 12/23/2015 12/17/2014 10/01/2013 09/26/2012 08/05/2011  Decreased Interest 0 0 0 0 0  Down, Depressed, Hopeless 0 0 0 0 0  PHQ - 2 Score 0 0 0 0 0    Fall Risk Screen: see questionnaire below. Fall Risk  12/09/2016 12/23/2015 12/17/2014 10/01/2013 09/26/2012  Falls in the past year? No No No No No  Comment Emmi Telephone Survey: data to providers prior to load - - - -    ADL screen:  See questionnaire below Functional Status Survey:     Review of Systems Constitutional: -, -unexpected weight change, -anorexia, -fatigue Allergy: -sneezing, -itching, -congestion Dermatology: denies  changing moles, rash, lumps ENT: -runny nose, -ear pain, -sore throat,  Cardiology:  -chest pain, -palpitations, -orthopnea, Respiratory: -cough, -shortness of breath, -dyspnea on exertion, -wheezing,  Gastroenterology: -abdominal pain, -nausea, -vomiting, -diarrhea, -constipation, -dysphagia Hematology: -bleeding or bruising problems Musculoskeletal: -arthralgias, -myalgias, -joint swelling, -back pain, - Ophthalmology: -vision changes,  Urology: -dysuria, -difficulty urinating,  -urinary frequency, -urgency, incontinence Neurology: -, -numbness, , -memory loss, -falls, -dizziness    PHYSICAL EXAM:   General Appearance: Alert, cooperative, no distress, appears stated age Head: Normocephalic, without obvious abnormality, atraumatic Eyes: PERRL, conjunctiva/corneas clear, EOM's intact, fundi benign Ears: Normal TM's and external ear canals Nose: Nares normal, mucosa normal, no drainage or sinus tenderness Throat: Lips, mucosa, and tongue normal; teeth and gums normal Neck: Supple, no lymphadenopathy;  thyroid:  no enlargement/tenderness/nodules; no carotid bruit or JVD Lungs: Clear to auscultation bilaterally without wheezes, rales or ronchi; respirations unlabored Heart: Regular rate and rhythm, S1 and S2 normal, no murmur, rubor gallop Abdomen: Soft, non-tender, nondistended, normoactive bowel sounds,  no masses, no hepatosplenomegaly Extremities: No clubbing, cyanosis or edema Pulses: 2+ and symmetric all extremities Skin:  Skin color, texture, turgor normal, no rashes or lesions Lymph nodes: Cervical, supraclavicular, and axillary nodes normal Neurologic:  CNII-XII intact, normal strength, sensation and gait; reflexes 2+ and symmetric throughout Psych: Normal mood, affect, hygiene and grooming.  ASSESSMENT/PLAN: Essential hypertension - Plan: CBC with Differential/Platelet, Comprehensive metabolic panel  ASHD (arteriosclerotic heart disease) - Plan: CBC with  Differential/Platelet, Comprehensive metabolic panel, Lipid panel  Chronic low back pain without sciatica, unspecified back pain laterality  Hyperlipidemia with target LDL less than 70 - Plan: Lipid panel  Gastroesophageal reflux disease without esophagitis  Presbycusis of both ears Strongly encouraged her to get involved in an exercise program that would help with his general overall care as well as with her back.  She will continue her medications except for the Prilosec.  She is to double that dosing for the next couple weeks and then try to back off based on her symptoms of reflux.  Medicare Attestation I have personally reviewed: The patient's medical and social history Their use of alcohol, tobacco or illicit drugs Their current medications and supplements The patient's functional ability including ADLs,fall risks, home safety risks, cognitive, and hearing and visual impairment Diet and physical activities Evidence for depression or mood disorders  The patient's weight, height, and BMI have been recorded in the chart.  I have made referrals, counseling, and provided education to the patient based on review of the above and I have provided the patient with a written personalized care plan for preventive services.     Jill Alexanders, MD   05/10/2017  Tanya Berry is a 82 y.o. female who presents for annual wellness visit and follow-up on chronic medical conditions.  She has the following concerns:  Immunizations and Health Maintenance Immunization History  Administered Date(s) Administered  . DTaP 03/22/2008  . Influenza Split 10/27/2010, 10/27/2011  . Influenza Whole 09/23/2009  . Influenza, High Dose Seasonal PF 10/01/2013, 12/17/2014, 11/11/2015, 10/05/2016  . Influenza,inj,Quad PF,6+ Mos 09/26/2012  . Pneumococcal Conjugate-13 10/19/2004  . Pneumococcal Polysaccharide-23 09/26/2012  . Td 05/20/1997  . Zoster 01/04/2009   Health Maintenance Due  Topic Date Due  .  TETANUS/TDAP  05/21/2007    Last Pap smear:2003 Last mammogram: 2 years ago Last colonoscopy:10 years ago Last DEXA: 2 years ago Dentist: 04/2017 Ophtho:04/2017 Exercise:walk  Other doctors caring for patient include:  Advanced directives:    Depression screen:  See questionnaire below.  Depression screen [facility] 2/9 12/23/2015 12/17/2014 10/01/2013 09/26/2012 08/05/2011  Decreased Interest 0 0 0 0 0  Down, Depressed, Hopeless 0 0 0 0 0  PHQ - 2 Score 0 0 0 0 0    Fall Risk Screen: see questionnaire below. Fall Risk  12/09/2016 12/23/2015 12/17/2014 10/01/2013 09/26/2012  Falls in the past year? No No No No No  Comment Emmi Telephone Survey: data to providers prior to load - - - -    ADL screen:  See questionnaire below Functional Status Survey:     Review of Systems Constitutional: -, -unexpected weight change, -anorexia, -fatigue Allergy: -sneezing, -itching, -congestion Dermatology: denies changing moles, rash, lumps ENT: -runny nose, -ear pain, -sore throat,  Cardiology:  -chest pain, -palpitations, -orthopnea, Respiratory: -cough, -shortness of breath, -dyspnea on exertion, -wheezing,  Gastroenterology: -abdominal pain, -nausea, -vomiting, -diarrhea, -constipation, -dysphagia Hematology: -bleeding or bruising problems Musculoskeletal: -arthralgias, -myalgias, -joint swelling, -back pain, - Ophthalmology: -vision changes,  Urology: -dysuria, -difficulty urinating,  -urinary frequency, -urgency, incontinence Neurology: -, -numbness, , -memory loss, -falls, -dizziness    PHYSICAL EXAM:  There were no vitals taken for this visit.  General Appearance: Alert, cooperative, no distress, appears stated age Head: Normocephalic, without obvious abnormality, atraumatic Eyes: PERRL, conjunctiva/corneas clear, EOM's intact, fundi benign Ears: Normal TM's and external ear canals Nose: Nares normal, mucosa normal, no drainage or sinus tenderness Throat: Lips, mucosa, and tongue  normal; teeth and gums normal Neck: Supple, no lymphadenopathy;  thyroid:  no enlargement/tenderness/nodules; no carotid bruit or JVD Lungs: Clear to auscultation  bilaterally without wheezes, rales or ronchi; respirations unlabored Heart: Regular rate and rhythm, S1 and S2 normal, no murmur, rubor gallop Abdomen: Soft, non-tender, nondistended, normoactive bowel sounds,  no masses, no hepatosplenomegaly Extremities: No clubbing, cyanosis or edema Pulses: 2+ and symmetric all extremities Skin:  Skin color, texture, turgor normal, no rashes or lesions Lymph nodes: Cervical, supraclavicular, and axillary nodes normal Neurologic:  CNII-XII intact, normal strength, sensation and gait; reflexes 2+ and symmetric throughout Psych: Normal mood, affect, hygiene and grooming.  ASSESSMENT/PLAN:    Discussed monthly self breast exams and yearly mammograms; at least 30 minutes of aerobic activity at least 5 days/week and weight-bearing exercise 2x/week; proper sunscreen use reviewed; healthy diet, including goals of calcium and vitamin D intake and alcohol recommendations (less than or equal to 1 drink/day) reviewed; regular seatbelt use; changing batteries in smoke detectors.  Immunization recommendations discussed.  Colonoscopy recommendations reviewed   Medicare Attestation I have personally reviewed: The patient's medical and social history Their use of alcohol, tobacco or illicit drugs Their current medications and supplements The patient's functional ability including ADLs,fall risks, home safety risks, cognitive, and hearing and visual impairment Diet and physical activities Evidence for depression or mood disorders  The patient's weight, height, and BMI have been recorded in the chart.  I have made referrals, counseling, and provided education to the patient based on review of the above and I have provided the patient with a written personalized care plan for preventive services.     Jill Alexanders, MD   05/10/2017

## 2017-05-10 NOTE — Patient Instructions (Signed)
Double the dose of your Prilosec for a couple of weeks to get your acid indigestion under control then see if you can start taking it every other day and still control your symptoms

## 2017-05-15 ENCOUNTER — Other Ambulatory Visit: Payer: Self-pay | Admitting: Family Medicine

## 2017-05-15 DIAGNOSIS — E785 Hyperlipidemia, unspecified: Secondary | ICD-10-CM

## 2017-05-16 ENCOUNTER — Other Ambulatory Visit: Payer: Self-pay

## 2017-05-16 DIAGNOSIS — E785 Hyperlipidemia, unspecified: Secondary | ICD-10-CM

## 2017-05-16 DIAGNOSIS — I10 Essential (primary) hypertension: Secondary | ICD-10-CM

## 2017-05-16 MED ORDER — ATORVASTATIN CALCIUM 10 MG PO TABS: 10.0000 mg | ORAL_TABLET | Freq: Every day | ORAL | 0 refills | Status: DC

## 2017-05-16 MED ORDER — LOSARTAN POTASSIUM-HCTZ 50-12.5 MG PO TABS: 1.0000 | ORAL_TABLET | Freq: Every day | ORAL | 0 refills | Status: DC

## 2017-05-16 MED ORDER — AMLODIPINE BESYLATE 5 MG PO TABS: 5.0000 mg | ORAL_TABLET | Freq: Every day | ORAL | 0 refills | Status: DC

## 2017-05-16 MED ORDER — OMEPRAZOLE 20 MG PO CPDR: 20.0000 mg | DELAYED_RELEASE_CAPSULE | Freq: Every day | ORAL | 0 refills | Status: DC

## 2017-08-01 ENCOUNTER — Other Ambulatory Visit: Payer: Self-pay | Admitting: Family Medicine

## 2017-08-02 ENCOUNTER — Other Ambulatory Visit: Payer: Self-pay | Admitting: Family Medicine

## 2017-08-02 DIAGNOSIS — I10 Essential (primary) hypertension: Secondary | ICD-10-CM

## 2017-08-14 ENCOUNTER — Other Ambulatory Visit: Payer: Self-pay | Admitting: Family Medicine

## 2017-08-14 DIAGNOSIS — E785 Hyperlipidemia, unspecified: Secondary | ICD-10-CM

## 2017-08-19 DIAGNOSIS — H401131 Primary open-angle glaucoma, bilateral, mild stage: Secondary | ICD-10-CM | POA: Diagnosis not present

## 2017-08-19 DIAGNOSIS — Z961 Presence of intraocular lens: Secondary | ICD-10-CM | POA: Diagnosis not present

## 2017-10-17 ENCOUNTER — Other Ambulatory Visit: Payer: Self-pay | Admitting: Family Medicine

## 2017-10-30 ENCOUNTER — Other Ambulatory Visit: Payer: Self-pay | Admitting: Family Medicine

## 2017-10-31 ENCOUNTER — Other Ambulatory Visit: Payer: Self-pay | Admitting: Family Medicine

## 2017-10-31 DIAGNOSIS — I10 Essential (primary) hypertension: Secondary | ICD-10-CM

## 2017-11-12 ENCOUNTER — Other Ambulatory Visit: Payer: Self-pay | Admitting: Family Medicine

## 2017-11-12 DIAGNOSIS — E785 Hyperlipidemia, unspecified: Secondary | ICD-10-CM

## 2017-11-14 DIAGNOSIS — Z961 Presence of intraocular lens: Secondary | ICD-10-CM | POA: Diagnosis not present

## 2017-11-14 DIAGNOSIS — H401131 Primary open-angle glaucoma, bilateral, mild stage: Secondary | ICD-10-CM | POA: Diagnosis not present

## 2018-01-25 ENCOUNTER — Other Ambulatory Visit (INDEPENDENT_AMBULATORY_CARE_PROVIDER_SITE_OTHER): Payer: Medicare Other

## 2018-01-25 DIAGNOSIS — Z23 Encounter for immunization: Secondary | ICD-10-CM

## 2018-01-28 ENCOUNTER — Other Ambulatory Visit: Payer: Self-pay | Admitting: Family Medicine

## 2018-01-29 ENCOUNTER — Other Ambulatory Visit: Payer: Self-pay | Admitting: Family Medicine

## 2018-01-29 DIAGNOSIS — I10 Essential (primary) hypertension: Secondary | ICD-10-CM

## 2018-02-28 ENCOUNTER — Telehealth: Payer: Self-pay

## 2018-02-28 NOTE — Telephone Encounter (Signed)
[facility] not have hydrochlorothiazide and Losartan combo. Please advise further

## 2018-03-01 NOTE — Telephone Encounter (Signed)
Pt med was called into [facility] and called pt pt to advise of script. [place]

## 2018-03-27 ENCOUNTER — Telehealth: Payer: Self-pay | Admitting: Family Medicine

## 2018-03-27 MED ORDER — LOSARTAN POTASSIUM-HCTZ 50-12.5 MG PO TABS: 1.0000 | ORAL_TABLET | Freq: Every day | ORAL | 1 refills | Status: DC

## 2018-03-27 NOTE — Telephone Encounter (Signed)
Pt called and needs a new prescription for [facility] soon as possible.

## 2018-03-28 ENCOUNTER — Telehealth: Payer: Self-pay

## 2018-03-28 NOTE — Telephone Encounter (Signed)
LVM for pt to advise med had been [facility]. [place]

## 2018-03-28 NOTE — Telephone Encounter (Signed)
Pt called and states she has to have 2 separate rx for losartan and HCTZ send asap to [facility].  They do not have the combo drug.

## 2018-03-28 NOTE — Telephone Encounter (Signed)
Done and pt called no answer LVM. [place]

## 2018-05-12 ENCOUNTER — Other Ambulatory Visit: Payer: Self-pay | Admitting: Family Medicine

## 2018-05-12 DIAGNOSIS — E785 Hyperlipidemia, unspecified: Secondary | ICD-10-CM

## 2018-06-05 ENCOUNTER — Ambulatory Visit (INDEPENDENT_AMBULATORY_CARE_PROVIDER_SITE_OTHER): Payer: Medicare Other | Admitting: Family Medicine

## 2018-06-05 ENCOUNTER — Other Ambulatory Visit: Payer: Self-pay

## 2018-06-05 ENCOUNTER — Encounter: Payer: Self-pay | Admitting: Family Medicine

## 2018-06-05 VITALS — BP 132/84 | HR 71 | Temp 98.4°F | Ht 63.5 in | Wt 149.2 lb

## 2018-06-05 DIAGNOSIS — K219 Gastro-esophageal reflux disease without esophagitis: Secondary | ICD-10-CM | POA: Diagnosis not present

## 2018-06-05 DIAGNOSIS — M8580 Other specified disorders of bone density and structure, unspecified site: Secondary | ICD-10-CM

## 2018-06-05 DIAGNOSIS — H9113 Presbycusis, bilateral: Secondary | ICD-10-CM | POA: Diagnosis not present

## 2018-06-05 DIAGNOSIS — Z0000 Encounter for general adult medical examination without abnormal findings: Secondary | ICD-10-CM | POA: Diagnosis not present

## 2018-06-05 DIAGNOSIS — E785 Hyperlipidemia, unspecified: Secondary | ICD-10-CM

## 2018-06-05 DIAGNOSIS — M545 Low back pain, unspecified: Secondary | ICD-10-CM

## 2018-06-05 DIAGNOSIS — E2839 Other primary ovarian failure: Secondary | ICD-10-CM

## 2018-06-05 DIAGNOSIS — H409 Unspecified glaucoma: Secondary | ICD-10-CM

## 2018-06-05 DIAGNOSIS — G8929 Other chronic pain: Secondary | ICD-10-CM | POA: Diagnosis not present

## 2018-06-05 DIAGNOSIS — I10 Essential (primary) hypertension: Secondary | ICD-10-CM

## 2018-06-05 DIAGNOSIS — I2510 Atherosclerotic heart disease of native coronary artery without angina pectoris: Secondary | ICD-10-CM

## 2018-06-05 DIAGNOSIS — L989 Disorder of the skin and subcutaneous tissue, unspecified: Secondary | ICD-10-CM

## 2018-06-05 MED ORDER — AMLODIPINE BESYLATE 5 MG PO TABS: ORAL_TABLET | ORAL | 3 refills | Status: DC

## 2018-06-05 MED ORDER — LOSARTAN POTASSIUM-HCTZ 50-12.5 MG PO TABS: 1.0000 | ORAL_TABLET | Freq: Every day | ORAL | 1 refills | Status: DC

## 2018-06-05 MED ORDER — METOPROLOL SUCCINATE ER 25 MG PO TB24: 25.0000 mg | ORAL_TABLET | Freq: Every day | ORAL | 3 refills | Status: DC

## 2018-06-05 MED ORDER — ATORVASTATIN CALCIUM 10 MG PO TABS: 10.0000 mg | ORAL_TABLET | Freq: Every day | ORAL | 3 refills | Status: DC

## 2018-06-05 NOTE — Patient Instructions (Signed)
  Tanya Berry , Thank you for taking time to come for your Medicare Wellness Visit. I appreciate your ongoing commitment to your health goals. Please review the following plan we discussed and let me know if I can assist you in the future.   These are the goals we discussed: Continue on your present medications.  We will set you up to see the dermatologist about the skin lesion.  Take your Nexium daily to get rid of your reflux symptoms. This is a list of the screening recommended for you and due dates:  Health Maintenance  Topic Date Due  . Tetanus Vaccine  05/21/2007  . Flu Shot  08/05/2018  . DEXA scan (bone density measurement)  Completed  . Pneumonia vaccines  Completed

## 2018-06-05 NOTE — Progress Notes (Signed)
Tanya Berry is a 83 y.o. female who presents for annual wellness visit,CPE and follow-up on chronic medical conditions.  She has a lesion on her left anterior thigh for the last 2 weeks.  Initially was slightly uncomfortable but now is just causing only minimal difficulty.  She also has had some difficulty with reflux symptoms but has been using the Nexium every other day due to concern she had over possible side effects from the medication.  She follows up regularly with her ophthalmologist concerning the glaucoma.  Her back pain is really not causing any trouble at the present time.  She has had no difficulty with chest pain, shortness of breath.  She continues on Lipitor, losartan/HCTZ, amlodipine and Toprol.  She does use one hearing aid as the other one was stepped on.  She has a history of osteopenia but has not had a DEXA scan in quite some time. Social and family history was reviewed Immunizations and Health Maintenance Immunization History  Administered Date(s) Administered  . DTaP 03/22/2008  . Influenza Split 10/27/2010, 10/27/2011  . Influenza Whole 09/23/2009  . Influenza, High Dose Seasonal PF 10/01/2013, 12/17/2014, 11/11/2015, 10/05/2016, 01/25/2018  . Influenza,inj,Quad PF,6+ Mos 09/26/2012  . Pneumococcal Conjugate-13 10/19/2004  . Pneumococcal Polysaccharide-23 09/26/2012  . Td 05/20/1997  . Zoster 01/04/2009  . Zoster Recombinat (Shingrix) 05/17/2017   Health Maintenance Due  Topic Date Due  . TETANUS/TDAP  05/21/2007    Last Pap smear:Aged out  Last mammogram: aged out last year Last colonoscopy: over 5 years Last DEXA:01-02-14 Dentist:05/2018 Ophtho: 2019 Exercise: walking   Other doctors caring for patient include:N/A  Advanced directives: Yes.  In chart.    Depression screen:  See questionnaire below.  Depression screen [facility] 2/9 06/05/2018 05/10/2017 12/23/2015 12/17/2014 10/01/2013  Decreased Interest 0 0 0 0 0  Down, Depressed, Hopeless 0 0 0 0 0  PHQ - 2  Score 0 0 0 0 0    Fall Risk Screen: see questionnaire below. Fall Risk  06/05/2018 05/10/2017 12/09/2016 12/23/2015 12/17/2014  Falls in the past year? 0 No No No No  Comment - - Emmi Telephone Survey: data to providers prior to load - -    ADL screen:  See questionnaire below Functional Status Survey: Is the patient deaf or have difficulty hearing?: No(hearing aid) Does the patient have difficulty seeing, even when wearing glasses/contacts?: Yes Does the patient have difficulty concentrating, remembering, or making decisions?: No Does the patient have difficulty walking or climbing stairs?: Yes(Knees) Does the patient have difficulty dressing or bathing?: No Does the patient have difficulty doing errands alone such as visiting a doctor's office or shopping?: No   Review of Systems Constitutional: -, -unexpected weight change, -anorexia, -fatigue Allergy: -sneezing, -itching, -congestion Dermatology: denies changing moles, rash, lumps ENT: -runny nose, -ear pain, -sore throat,  Cardiology:  -chest pain, -palpitations, -orthopnea, Respiratory: -cough, -shortness of breath, -dyspnea on exertion, -wheezing,  Gastroenterology: -abdominal pain, -nausea, -vomiting, -diarrhea, -constipation, -dysphagia Hematology: -bleeding or bruising problems Musculoskeletal: -arthralgias, -myalgias, -joint swelling, -back pain, - Ophthalmology: -vision changes,  Urology: -dysuria, -difficulty urinating,  -urinary frequency, -urgency, incontinence Neurology: -, -numbness, , -memory loss, -falls, -dizziness    PHYSICAL EXAM:  BP 132/84 (BP Location: Left Arm, Patient Position: Sitting)   Pulse 71   Temp 98.4 F (36.9 C)   Ht 5' 3.5" (1.613 m)   Wt 149 lb 3.2 oz (67.7 kg)   SpO2 97%   BMI 26.02 kg/m   General Appearance: Alert, cooperative,  no distress, appears stated age Head: Normocephalic, without obvious abnormality, atraumatic Eyes: PERRL, conjunctiva/corneas clear, EOM's intact, fundi  benign Ears: Normal TM's and external ear canals Nose: Nares normal, mucosa normal, no drainage or sinus tenderness Throat: Lips, mucosa, and tongue normal; teeth and gums normal Neck: Supple, no lymphadenopathy;  thyroid:  no enlargement/tenderness/nodules; no carotid bruit or JVD Lungs: Clear to auscultation bilaterally without wheezes, rales or ronchi; respirations unlabored Heart: Regular rate and rhythm, S1 and S2 normal, no murmur, rubor gallop Abdomen: Soft, non-tender, nondistended, normoactive bowel sounds,  no masses, no hepatosplenomegaly Extremities: No clubbing, cyanosis or edema Pulses: 2+ and symmetric all extremities Skin:  Skin color, texture, turgor normal, no rashes or lesions Lymph nodes: Cervical, supraclavicular, and axillary nodes normal Neurologic:  CNII-XII intact, normal strength, sensation and gait; reflexes 2+ and symmetric throughout Psych: Normal mood, affect, hygiene and grooming.  ASSESSMENT/PLAN: Skin lesion of left lower limb - Plan: Ambulatory referral to Dermatology  Chronic low back pain without sciatica, unspecified back pain laterality  ASHD (arteriosclerotic heart disease)  Gastroesophageal reflux disease without esophagitis  Hyperlipidemia with target LDL less than 70 - Plan: Lipid panel  Essential hypertension - Plan: CBC with Differential/Platelet, Comprehensive metabolic panel  Presbycusis of both ears  Osteopenia, unspecified location - Plan: DG Bone Density  Estrogen deficiency - Plan: DG Bone Density  Glaucoma of both eyes, unspecified glaucoma type  Instructed to take Nexium daily to get rid of the reflux symptoms.  Continue on present medications.  Immunization recommendations discussed.  Colonoscopy recommendations reviewed   Medicare Attestation I have personally reviewed: The patient's medical and social history Their use of alcohol, tobacco or illicit drugs Their current medications and supplements The patient's  functional ability including ADLs,fall risks, home safety risks, cognitive, and hearing and visual impairment Diet and physical activities Evidence for depression or mood disorders  The patient's weight, height, and BMI have been recorded in the chart.  I have made referrals, counseling, and provided education to the patient based on review of the above and I have provided the patient with a written personalized care plan for preventive services.     Jill Alexanders, MD   06/05/2018

## 2018-06-06 LAB — CBC WITH DIFFERENTIAL/PLATELET
Basophils Absolute: 0.1 10*3/uL (ref 0.0–0.2)
Basos: 1 %
EOS (ABSOLUTE): 0.1 10*3/uL (ref 0.0–0.4)
Eos: 2 %
Hematocrit: 43.5 % (ref 34.0–46.6)
Hemoglobin: 15.6 g/dL (ref 11.1–15.9)
Immature Grans (Abs): 0 10*3/uL (ref 0.0–0.1)
Immature Granulocytes: 0 %
Lymphocytes Absolute: 1.4 10*3/uL (ref 0.7–3.1)
Lymphs: 27 %
MCH: 30.8 pg (ref 26.6–33.0)
MCHC: 35.9 g/dL — ABNORMAL HIGH (ref 31.5–35.7)
MCV: 86 fL (ref 79–97)
Monocytes Absolute: 0.6 10*3/uL (ref 0.1–0.9)
Monocytes: 12 %
Neutrophils Absolute: 2.9 10*3/uL (ref 1.4–7.0)
Neutrophils: 58 %
Platelets: 244 10*3/uL (ref 150–450)
RBC: 5.07 x10E6/uL (ref 3.77–5.28)
RDW: 13.2 % (ref 11.7–15.4)
WBC: 5.1 10*3/uL (ref 3.4–10.8)

## 2018-06-06 LAB — COMPREHENSIVE METABOLIC PANEL
ALT: 15 IU/L (ref 0–32)
AST: 20 IU/L (ref 0–40)
Albumin/Globulin Ratio: 1.8 (ref 1.2–2.2)
Albumin: 4.8 g/dL — ABNORMAL HIGH (ref 3.6–4.6)
Alkaline Phosphatase: 69 IU/L (ref 39–117)
BUN/Creatinine Ratio: 14 (ref 12–28)
BUN: 16 mg/dL (ref 8–27)
Bilirubin Total: 0.5 mg/dL (ref 0.0–1.2)
CO2: 27 mmol/L (ref 20–29)
Calcium: 10.2 mg/dL (ref 8.7–10.3)
Chloride: 97 mmol/L (ref 96–106)
Creatinine, Ser: 1.13 mg/dL — ABNORMAL HIGH (ref 0.57–1.00)
GFR calc Af Amer: 53 mL/min/{1.73_m2} — ABNORMAL LOW (ref >59)
GFR calc non Af Amer: 46 mL/min/{1.73_m2} — ABNORMAL LOW (ref >59)
Globulin, Total: 2.6 g/dL (ref 1.5–4.5)
Glucose: 104 mg/dL — ABNORMAL HIGH (ref 65–99)
Potassium: 4.7 mmol/L (ref 3.5–5.2)
Sodium: 140 mmol/L (ref 134–144)
Total Protein: 7.4 g/dL (ref 6.0–8.5)

## 2018-06-06 LAB — LIPID PANEL
Chol/HDL Ratio: 2.9 ratio (ref 0.0–4.4)
Cholesterol, Total: 196 mg/dL (ref 100–199)
HDL: 68 mg/dL (ref >39)
LDL Calculated: 102 mg/dL — ABNORMAL HIGH (ref 0–99)
Triglycerides: 128 mg/dL (ref 0–149)
VLDL Cholesterol Cal: 26 mg/dL (ref 5–40)

## 2018-06-07 ENCOUNTER — Other Ambulatory Visit: Payer: Self-pay

## 2018-06-07 ENCOUNTER — Telehealth: Payer: Self-pay | Admitting: Family Medicine

## 2018-06-07 MED ORDER — LOSARTAN POTASSIUM 50 MG PO TABS: 50.0000 mg | ORAL_TABLET | Freq: Every day | ORAL | 1 refills | Status: DC

## 2018-06-07 MED ORDER — HYDROCHLOROTHIAZIDE 12.5 MG PO CAPS: 12.5000 mg | ORAL_CAPSULE | Freq: Every day | ORAL | 1 refills | Status: DC

## 2018-06-07 NOTE — Telephone Encounter (Signed)
[facility] over [facility] [place]

## 2018-06-07 NOTE — Telephone Encounter (Signed)
Check with her local pharmacy to see if they have this rather than switching medications

## 2018-06-07 NOTE — Telephone Encounter (Signed)
Pharmacy states that losartan/hctz 50/12.5 is temporarily out of stock, and states that the brand name is not a option, and is requesting that there be a different drug sent in please send to [place], [place]

## 2018-06-22 ENCOUNTER — Telehealth: Payer: Self-pay | Admitting: Family Medicine

## 2018-06-22 MED ORDER — LOSARTAN POTASSIUM 50 MG PO TABS: 50.0000 mg | ORAL_TABLET | Freq: Every day | ORAL | 1 refills | Status: DC

## 2018-06-22 NOTE — Telephone Encounter (Signed)
Check with her local store and see if we can get her the med. Let her know that switching is not the best way to handle this

## 2018-06-22 NOTE — Telephone Encounter (Signed)
Pt wanted losartan 50mg  #90 to be sent in to [facility]. pharamcy did have med in stock. Pt was notified

## 2018-06-22 NOTE — Telephone Encounter (Signed)
Pt called and states that the [facility] is [facility] states that she needs another Blood pressure medicine she has the HCTZ, she [facility] that they have olmesartan and irdesartan in stock if you want to switch her to that, pt would like the rx sent to [place] [facility] [place], [place] and pt would like to be called after it is sent to the pharmacy

## 2018-07-05 ENCOUNTER — Telehealth: Payer: Self-pay | Admitting: Family Medicine

## 2018-07-05 NOTE — Telephone Encounter (Signed)
Order was faxed over. [facility] 

## 2018-07-05 NOTE — Telephone Encounter (Signed)
Pt left message that [facility] t# [phone] is where pt has been before and that is where she wants order sent for Bone Density Scan

## 2018-07-10 DIAGNOSIS — Z961 Presence of intraocular lens: Secondary | ICD-10-CM | POA: Diagnosis not present

## 2018-07-10 DIAGNOSIS — H401131 Primary open-angle glaucoma, bilateral, mild stage: Secondary | ICD-10-CM | POA: Diagnosis not present

## 2018-07-31 ENCOUNTER — Telehealth: Payer: Self-pay

## 2018-07-31 NOTE — Telephone Encounter (Signed)
Called pt to find out if she has already been to see dermatology. [place]

## 2018-10-02 ENCOUNTER — Other Ambulatory Visit (INDEPENDENT_AMBULATORY_CARE_PROVIDER_SITE_OTHER): Payer: Medicare Other

## 2018-10-02 ENCOUNTER — Other Ambulatory Visit: Payer: Self-pay

## 2018-10-02 DIAGNOSIS — Z23 Encounter for immunization: Secondary | ICD-10-CM

## 2018-11-25 ENCOUNTER — Other Ambulatory Visit: Payer: Self-pay | Admitting: Family Medicine

## 2018-12-18 ENCOUNTER — Telehealth: Payer: Self-pay | Admitting: Family Medicine

## 2018-12-18 ENCOUNTER — Other Ambulatory Visit: Payer: Self-pay | Admitting: Family Medicine

## 2018-12-18 NOTE — Telephone Encounter (Signed)
[facility] pt called for refills of losartan. [facility] with 1 refill to last until she comes in June.

## 2019-01-25 ENCOUNTER — Ambulatory Visit: Payer: Medicare Other | Attending: Internal Medicine

## 2019-01-25 DIAGNOSIS — Z23 Encounter for immunization: Secondary | ICD-10-CM | POA: Insufficient documentation

## 2019-01-25 NOTE — Progress Notes (Signed)
   Covid-19 Vaccination Clinic  Name:  Tanya Berry    MRN: [id number] DOB: 23-Nov-1935  01/25/2019  [name] was observed post Covid-19 immunization for 15 minutes without incidence. She was provided with Vaccine Information Sheet and instruction to access the V-Safe system.   [name] was instructed to call 911 with any severe reactions post vaccine: Marland Kitchen Difficulty breathing  . Swelling of your face and throat  . A fast heartbeat  . A bad rash all over your body  . Dizziness and weakness    Immunizations Administered    Name Date Dose VIS Date Route   Pfizer COVID-19 Vaccine 01/25/2019  9:35 AM 0.3 mL 12/15/2018 Intramuscular   Manufacturer: [place]   Lot: [id number]   [place]: [id number]

## 2019-02-13 ENCOUNTER — Ambulatory Visit: Payer: Medicare Other | Attending: Internal Medicine

## 2019-02-15 ENCOUNTER — Ambulatory Visit: Payer: Medicare Other | Attending: Internal Medicine

## 2019-02-15 DIAGNOSIS — Z23 Encounter for immunization: Secondary | ICD-10-CM

## 2019-02-15 NOTE — Progress Notes (Signed)
   Covid-19 Vaccination Clinic  Name:  Tanya Berry    MRN: [id number] DOB: 09-Feb-1935  02/15/2019  [name] was observed post Covid-19 immunization for 15 minutes without incidence. She was provided with Vaccine Information Sheet and instruction to access the V-Safe system.   [name] was instructed to call 911 with any severe reactions post vaccine: Marland Kitchen Difficulty breathing  . Swelling of your face and throat  . A fast heartbeat  . A bad rash all over your body  . Dizziness and weakness    Immunizations Administered    Name Date Dose VIS Date Route   Pfizer COVID-19 Vaccine 02/15/2019  9:33 AM 0.3 mL 12/15/2018 Intramuscular   Manufacturer: [place]   Lot: [place] [id number]   [place]: [id number]

## 2019-03-09 ENCOUNTER — Other Ambulatory Visit: Payer: Self-pay | Admitting: Family Medicine

## 2019-04-20 DIAGNOSIS — H02056 Trichiasis without entropian left eye, unspecified eyelid: Secondary | ICD-10-CM | POA: Diagnosis not present

## 2019-04-20 DIAGNOSIS — Z961 Presence of intraocular lens: Secondary | ICD-10-CM | POA: Diagnosis not present

## 2019-04-20 DIAGNOSIS — H401131 Primary open-angle glaucoma, bilateral, mild stage: Secondary | ICD-10-CM | POA: Diagnosis not present

## 2019-05-08 DIAGNOSIS — H6123 Impacted cerumen, bilateral: Secondary | ICD-10-CM | POA: Diagnosis not present

## 2019-06-11 ENCOUNTER — Other Ambulatory Visit: Payer: Self-pay | Admitting: Family Medicine

## 2019-06-12 ENCOUNTER — Other Ambulatory Visit: Payer: Self-pay

## 2019-06-12 ENCOUNTER — Encounter: Payer: Self-pay | Admitting: Family Medicine

## 2019-06-12 ENCOUNTER — Ambulatory Visit (INDEPENDENT_AMBULATORY_CARE_PROVIDER_SITE_OTHER): Payer: Medicare Other | Admitting: Family Medicine

## 2019-06-12 VITALS — BP 146/88 | HR 72 | Temp 98.0°F | Ht 62.5 in | Wt 149.0 lb

## 2019-06-12 DIAGNOSIS — G8929 Other chronic pain: Secondary | ICD-10-CM

## 2019-06-12 DIAGNOSIS — H409 Unspecified glaucoma: Secondary | ICD-10-CM

## 2019-06-12 DIAGNOSIS — E785 Hyperlipidemia, unspecified: Secondary | ICD-10-CM

## 2019-06-12 DIAGNOSIS — K219 Gastro-esophageal reflux disease without esophagitis: Secondary | ICD-10-CM

## 2019-06-12 DIAGNOSIS — H9113 Presbycusis, bilateral: Secondary | ICD-10-CM | POA: Diagnosis not present

## 2019-06-12 DIAGNOSIS — N1831 Chronic kidney disease, stage 3a: Secondary | ICD-10-CM | POA: Diagnosis not present

## 2019-06-12 DIAGNOSIS — I10 Essential (primary) hypertension: Secondary | ICD-10-CM | POA: Diagnosis not present

## 2019-06-12 DIAGNOSIS — M8580 Other specified disorders of bone density and structure, unspecified site: Secondary | ICD-10-CM | POA: Diagnosis not present

## 2019-06-12 DIAGNOSIS — I447 Left bundle-branch block, unspecified: Secondary | ICD-10-CM

## 2019-06-12 DIAGNOSIS — M545 Low back pain, unspecified: Secondary | ICD-10-CM

## 2019-06-12 DIAGNOSIS — E2839 Other primary ovarian failure: Secondary | ICD-10-CM

## 2019-06-12 DIAGNOSIS — I2510 Atherosclerotic heart disease of native coronary artery without angina pectoris: Secondary | ICD-10-CM | POA: Diagnosis not present

## 2019-06-12 MED ORDER — LOSARTAN POTASSIUM-HCTZ 50-12.5 MG PO TABS: 1.0000 | ORAL_TABLET | Freq: Every day | ORAL | 3 refills | Status: DC

## 2019-06-12 MED ORDER — ATORVASTATIN CALCIUM 10 MG PO TABS: 10.0000 mg | ORAL_TABLET | Freq: Every day | ORAL | 3 refills | Status: DC

## 2019-06-12 MED ORDER — METOPROLOL SUCCINATE ER 25 MG PO TB24: 25.0000 mg | ORAL_TABLET | Freq: Every day | ORAL | 3 refills | Status: DC

## 2019-06-12 MED ORDER — AMLODIPINE BESYLATE 5 MG PO TABS: ORAL_TABLET | ORAL | 3 refills | Status: DC

## 2019-06-12 NOTE — Progress Notes (Signed)
Jill Alexanders, MD   06/12/2019  Tanya Berry is a 84 y.o. female who presents for annual wellness visit and follow-up on chronic medical conditions.  She has the following concerns:She no particular concerns or complaints.  She does have presbycusis and is planning on getting hearing aids.  She also has a history of ASHD and has had a previous stent however she has not been seen by cardiology in quite some time.  She has had no difficulty with chest pain, shortness of breath, PND or DOE.  She continues on losartan, amlodipine, metoprolol.  She is also taking atorvastatin and having no aches or pains with that.  She does use Prilosec on an as-needed basis.  She does take extra calcium and vitamin D.  Review of the record indicates she does have CKD.  She does follow-up regularly with her ophthalmologist and does have a history of osteopenia.   Immunizations and Health Maintenance Immunization History  Administered Date(s) Administered  . DTaP 03/22/2008  . Fluad Quad(high Dose 65+) 10/02/2018  . Influenza Split 10/27/2010, 10/27/2011  . Influenza Whole 09/23/2009  . Influenza, High Dose Seasonal PF 10/01/2013, 12/17/2014, 11/11/2015, 10/05/2016, 01/25/2018  . Influenza,inj,Quad PF,6+ Mos 09/26/2012  . PFIZER SARS-COV-2 Vaccination 01/25/2019, 02/15/2019  . Pneumococcal Conjugate-13 10/19/2004  . Pneumococcal Polysaccharide-23 09/26/2012  . Td 05/20/1997  . Zoster 01/04/2009  . Zoster Recombinat (Shingrix) 05/17/2017, 08/02/2017   Health Maintenance Due  Topic Date Due  . TETANUS/TDAP  05/21/2007    Last Pap smear: aged out   Last mammogram: 01/02/14 Last colonoscopy: aged out  Last DEXA: 01-02-14 Dentist: Q three months Ophtho: yearly Exercise: staying active   Other doctors caring for patient include: N/A  Advanced directives:  Not on file   Does Patient Have a Medical Advance Directive?: Yes Type of Advance Directive: [place] will Does patient  want to make changes to medical advance directive?: No - Patient declined Talent in Chart?: No - copy requested  Depression screen:  See questionnaire below.  Depression screen [facility] 2/9 06/12/2019 06/05/2018 05/10/2017 12/23/2015 12/17/2014  Decreased Interest 0 0 0 0 0  Down, Depressed, Hopeless 0 0 0 0 0  PHQ - 2 Score 0 0 0 0 0    Fall Risk Screen: see questionnaire below. Fall Risk  06/12/2019 06/05/2018 05/10/2017 12/09/2016 12/23/2015  Falls in the past year? 0 0 No No No  Comment - - - Emmi Telephone Survey: data to providers prior to load -    ADL screen:  See questionnaire below Functional Status Survey: Is the patient deaf or have difficulty hearing?: Yes Does the patient have difficulty seeing, even when wearing glasses/contacts?: Yes Does the patient have difficulty concentrating, remembering, or making decisions?: No Does the patient have difficulty walking or climbing stairs?: No Does the patient have difficulty dressing or bathing?: No Does the patient have difficulty doing errands alone such as visiting a doctor's office or shopping?: No   Review of Systems Constitutional: -, -unexpected weight change, -anorexia, -fatigue Allergy: -sneezing, -itching, -congestion Dermatology: denies changing moles, rash, lumps ENT: -runny nose, -ear pain, -sore throat,  Cardiology:  -chest pain, -palpitations, -orthopnea, Respiratory: -cough, -shortness of breath, -dyspnea on exertion, -wheezing,  Gastroenterology: -abdominal pain, -nausea, -vomiting, -diarrhea, -constipation, -dysphagia Hematology: -bleeding or bruising problems Musculoskeletal: -arthralgias, -myalgias, -joint swelling, -back pain, - Ophthalmology: -vision changes,  Urology: -dysuria, -difficulty urinating,  -urinary frequency, -urgency, incontinence Neurology: -, -numbness, , -memory loss, -falls, -  dizziness    PHYSICAL EXAM:  BP (!) 146/88   Pulse 72   Temp 98 F (36.7 C)   Ht 5' 2.5"  (1.588 m)   Wt 149 lb (67.6 kg)   SpO2 96%   BMI 26.82 kg/m   General Appearance: Alert, cooperative, no distress, appears stated age Head: Normocephalic, without obvious abnormality, atraumatic Eyes: PERRL, conjunctiva/corneas clear, EOM's intact,  Ears: Normal TM's and external ear canals Nose: Nares normal, mucosa normal, no drainage or sinus tenderness Throat: Lips, mucosa, and tongue normal; teeth and gums normal Neck: Supple, no lymphadenopathy;  thyroid:  no enlargement/tenderness/nodules; no carotid bruit or JVD Lungs: Clear to auscultation bilaterally without wheezes, rales or ronchi; respirations unlabored Heart: Regular rate and rhythm, S1 and S2 normal, no murmur, rubor gallop Abdomen: Soft, non-tender, nondistended, normoactive bowel sounds,  no masses, no hepatosplenomegaly Extremities: No clubbing, cyanosis or edema Pulses: 2+ and symmetric all extremities Skin:  Skin color, texture, turgor normal, no rashes or lesions Lymph nodes: Cervical, supraclavicular, and axillary nodes normal Neurologic:  CNII-XII intact, normal strength, sensation and gait; reflexes 2+ and symmetric throughout Psych: Normal mood, affect, hygiene and grooming. EKG read by me does show LBBB which is unchanged from the previous tracing. ASSESSMENT/PLAN: Presbycusis of both ears  Hyperlipidemia with target LDL less than 70 - Plan: atorvastatin (LIPITOR) 10 MG tablet  Essential hypertension - Plan: losartan-hydrochlorothiazide (HYZAAR) 50-12.5 MG tablet, amLODipine (NORVASC) 5 MG tablet, metoprolol succinate (TOPROL XL) 25 MG 24 hr tablet  Osteopenia, unspecified location - Plan: DG Bone Density  Glaucoma of both eyes, unspecified glaucoma type  Gastroesophageal reflux disease without esophagitis  Chronic low back pain without sciatica, unspecified back pain laterality  ASHD (arteriosclerotic heart disease) - Plan: metoprolol succinate (TOPROL XL) 25 MG 24 hr tablet, EKG 12-Lead, CBC with  Differential/Platelet, Comprehensive metabolic panel, Lipid panel  Stage 3a chronic kidney disease - Plan: CBC with Differential/Platelet, Comprehensive metabolic panel  Estrogen deficiency - Plan: DG Bone Density  LBBB (left bundle branch block)  At this point since he has been doing quite well taking care of herself.  I did recommend she continue with her physical therapy for her chronic back pain.  She will continue on her other medications and follow-up on her glaucoma as well as getting hearing aids.  She is not interested in pursuing any cardiology evaluation.  Discussed the kidney disease with her explaining that at this point of be watchful waiting.  She will also be set up for a bone density scan.   Discussed monthly self breast exams and yearly mammograms; at least 30 minutes of aerobic activity at least 5 days/week and weight-bearing exercise 2x/week; proper sunscreen use reviewed; healthy diet, including goals of calcium and vitamin D intake and alcohol recommendations (less than or equal to 1 drink/day) reviewed; regular seatbelt use; changing batteries in smoke detectors.  Immunization recommendations discussed.  Colonoscopy recommendations reviewed   Medicare Attestation I have personally reviewed: The patient's medical and social history Their use of alcohol, tobacco or illicit drugs Their current medications and supplements The patient's functional ability including ADLs,fall risks, home safety risks, cognitive, and hearing and visual impairment Diet and physical activities Evidence for depression or mood disorders  The patient's weight, height, and BMI have been recorded in the chart.  I have made referrals, counseling, and provided education to the patient based on review of the above and I have provided the patient with a written personalized care plan for preventive services.  Jill Alexanders, MD   06/12/2019

## 2019-06-12 NOTE — Patient Instructions (Signed)
  Tanya Berry , Thank you for taking time to come for your Medicare Wellness Visit. I appreciate your ongoing commitment to your health goals. Please review the following plan we discussed and let me know if I can assist you in the future.   These are the goals we discussed: Goals   None     This is a list of the screening recommended for you and due dates:  Health Maintenance  Topic Date Due  . Tetanus Vaccine  05/21/2007  . Flu Shot  08/05/2019  . DEXA scan (bone density measurement)  Completed  . COVID-19 Vaccine  Completed  . Pneumonia vaccines  Completed

## 2019-06-13 LAB — CBC WITH DIFFERENTIAL/PLATELET
Basophils Absolute: 0.1 10*3/uL (ref 0.0–0.2)
Basos: 1 %
EOS (ABSOLUTE): 0.2 10*3/uL (ref 0.0–0.4)
Eos: 3 %
Hematocrit: 46.3 % (ref 34.0–46.6)
Hemoglobin: 15.5 g/dL (ref 11.1–15.9)
Immature Grans (Abs): 0 10*3/uL (ref 0.0–0.1)
Immature Granulocytes: 0 %
Lymphocytes Absolute: 1.3 10*3/uL (ref 0.7–3.1)
Lymphs: 27 %
MCH: 28.8 pg (ref 26.6–33.0)
MCHC: 33.5 g/dL (ref 31.5–35.7)
MCV: 86 fL (ref 79–97)
Monocytes Absolute: 0.6 10*3/uL (ref 0.1–0.9)
Monocytes: 12 %
Neutrophils Absolute: 2.7 10*3/uL (ref 1.4–7.0)
Neutrophils: 57 %
Platelets: 242 10*3/uL (ref 150–450)
RBC: 5.39 x10E6/uL — ABNORMAL HIGH (ref 3.77–5.28)
RDW: 13.2 % (ref 11.7–15.4)
WBC: 4.8 10*3/uL (ref 3.4–10.8)

## 2019-06-13 LAB — COMPREHENSIVE METABOLIC PANEL
ALT: 15 IU/L (ref 0–32)
AST: 21 IU/L (ref 0–40)
Albumin/Globulin Ratio: 1.6 (ref 1.2–2.2)
Albumin: 4.6 g/dL (ref 3.6–4.6)
Alkaline Phosphatase: 72 IU/L (ref 48–121)
BUN/Creatinine Ratio: 13 (ref 12–28)
BUN: 13 mg/dL (ref 8–27)
Bilirubin Total: 0.5 mg/dL (ref 0.0–1.2)
CO2: 24 mmol/L (ref 20–29)
Calcium: 9.7 mg/dL (ref 8.7–10.3)
Chloride: 102 mmol/L (ref 96–106)
Creatinine, Ser: 1.03 mg/dL — ABNORMAL HIGH (ref 0.57–1.00)
GFR calc Af Amer: 58 mL/min/{1.73_m2} — ABNORMAL LOW (ref >59)
GFR calc non Af Amer: 51 mL/min/{1.73_m2} — ABNORMAL LOW (ref >59)
Globulin, Total: 2.8 g/dL (ref 1.5–4.5)
Glucose: 103 mg/dL — ABNORMAL HIGH (ref 65–99)
Potassium: 4.8 mmol/L (ref 3.5–5.2)
Sodium: 141 mmol/L (ref 134–144)
Total Protein: 7.4 g/dL (ref 6.0–8.5)

## 2019-06-13 LAB — LIPID PANEL
Chol/HDL Ratio: 3 ratio (ref 0.0–4.4)
Cholesterol, Total: 198 mg/dL (ref 100–199)
HDL: 67 mg/dL (ref >39)
LDL Chol Calc (NIH): 107 mg/dL — ABNORMAL HIGH (ref 0–99)
Triglycerides: 140 mg/dL (ref 0–149)
VLDL Cholesterol Cal: 24 mg/dL (ref 5–40)

## 2019-06-26 LAB — HM DEXA SCAN

## 2019-07-04 ENCOUNTER — Encounter: Payer: Self-pay | Admitting: Family Medicine

## 2019-07-04 ENCOUNTER — Other Ambulatory Visit: Payer: Self-pay

## 2019-07-04 ENCOUNTER — Telehealth (INDEPENDENT_AMBULATORY_CARE_PROVIDER_SITE_OTHER): Payer: Medicare Other | Admitting: Family Medicine

## 2019-07-04 VITALS — Wt 149.0 lb

## 2019-07-04 DIAGNOSIS — M810 Age-related osteoporosis without current pathological fracture: Secondary | ICD-10-CM

## 2019-07-04 MED ORDER — PROLIA 60 MG/ML ~~LOC~~ SOSY: 60.0000 mg | PREFILLED_SYRINGE | SUBCUTANEOUS | 1 refills | Status: DC

## 2019-07-04 NOTE — Progress Notes (Signed)
   Subjective:    Patient ID: Tanya Berry, female    DOB: 05-04-1935, 84 y.o.   MRN: [id number]  HPI I connected with  Tanya Berry on 07/04/19 by a video enabled telemedicine application and verified that I am speaking with the correct person using two identifiers. I discussed the limitations of evaluation and management by telemedicine. The patient expressed understanding and agreed to proceed. She is not on my chart and therefore [employer] could not be used. She had a recent DEXA scan that I discussed with her.  Previous DEXA does show that she was osteopenic.  Now her T score is -3.0.   Review of Systems     Objective:   Physical Exam Alert and in no distress otherwise not examined      Assessment & Plan:  Osteoporosis without current pathological fracture, unspecified osteoporosis type - Plan: DISCONTINUED: denosumab (PROLIA) 60 MG/ML SOSY injection I discussed options with her concerning oral medications versus injectable.  She would like to go ahead and get the injectable medication.  The order was placed.  8 minutes total time spent discussing this with her.

## 2019-08-07 ENCOUNTER — Other Ambulatory Visit (INDEPENDENT_AMBULATORY_CARE_PROVIDER_SITE_OTHER): Payer: Medicare Other

## 2019-08-07 ENCOUNTER — Other Ambulatory Visit: Payer: Self-pay

## 2019-08-07 DIAGNOSIS — M810 Age-related osteoporosis without current pathological fracture: Secondary | ICD-10-CM | POA: Diagnosis not present

## 2019-08-07 MED ORDER — DENOSUMAB 60 MG/ML ~~LOC~~ SOSY
60.0000 mg | PREFILLED_SYRINGE | Freq: Once | SUBCUTANEOUS | Status: AC
  Administered 2019-08-07: 60 mg via SUBCUTANEOUS

## 2019-10-04 ENCOUNTER — Telehealth: Payer: Self-pay | Admitting: Family Medicine

## 2019-10-04 NOTE — Telephone Encounter (Signed)
Pt called and states that [facility] that losartan-hctz 50-12.5 has been recalled. She would like something else to take the place. Please advise pt at [phone] or [phone]. [facility]

## 2019-10-04 NOTE — Telephone Encounter (Signed)
Have her get up on her local pharmacy for the time being

## 2019-10-05 DIAGNOSIS — Z23 Encounter for immunization: Secondary | ICD-10-CM | POA: Diagnosis not present

## 2019-10-05 NOTE — Telephone Encounter (Signed)
Pt was advised she could take the med she got today due to it being a different brand than the recalled. [place]

## 2019-10-23 ENCOUNTER — Other Ambulatory Visit (INDEPENDENT_AMBULATORY_CARE_PROVIDER_SITE_OTHER): Payer: Medicare Other

## 2019-10-23 ENCOUNTER — Other Ambulatory Visit: Payer: Self-pay

## 2019-10-23 DIAGNOSIS — Z23 Encounter for immunization: Secondary | ICD-10-CM | POA: Diagnosis not present

## 2019-11-08 DIAGNOSIS — Z20828 Contact with and (suspected) exposure to other viral communicable diseases: Secondary | ICD-10-CM | POA: Diagnosis not present

## 2019-12-24 ENCOUNTER — Other Ambulatory Visit: Payer: Self-pay | Admitting: Family Medicine

## 2020-01-17 DIAGNOSIS — Z1152 Encounter for screening for COVID-19: Secondary | ICD-10-CM | POA: Diagnosis not present

## 2020-02-05 ENCOUNTER — Telehealth: Payer: Self-pay | Admitting: Family Medicine

## 2020-02-05 NOTE — Telephone Encounter (Signed)
Left message for pt to call me back concerning Prolia

## 2020-02-11 DIAGNOSIS — H02056 Trichiasis without entropian left eye, unspecified eyelid: Secondary | ICD-10-CM | POA: Diagnosis not present

## 2020-02-11 DIAGNOSIS — Z961 Presence of intraocular lens: Secondary | ICD-10-CM | POA: Diagnosis not present

## 2020-02-11 DIAGNOSIS — H401131 Primary open-angle glaucoma, bilateral, mild stage: Secondary | ICD-10-CM | POA: Diagnosis not present

## 2020-02-19 ENCOUNTER — Other Ambulatory Visit: Payer: Self-pay

## 2020-02-19 ENCOUNTER — Other Ambulatory Visit: Payer: Medicare Other

## 2020-02-19 DIAGNOSIS — M810 Age-related osteoporosis without current pathological fracture: Secondary | ICD-10-CM

## 2020-02-19 MED ORDER — DENOSUMAB 60 MG/ML ~~LOC~~ SOSY: 60.0000 mg | PREFILLED_SYRINGE | Freq: Once | SUBCUTANEOUS | Status: DC

## 2020-02-21 DIAGNOSIS — Z1152 Encounter for screening for COVID-19: Secondary | ICD-10-CM | POA: Diagnosis not present

## 2020-05-29 DIAGNOSIS — Z20828 Contact with and (suspected) exposure to other viral communicable diseases: Secondary | ICD-10-CM | POA: Diagnosis not present

## 2020-06-12 ENCOUNTER — Other Ambulatory Visit: Payer: Self-pay

## 2020-06-12 ENCOUNTER — Encounter: Payer: Self-pay | Admitting: Family Medicine

## 2020-06-12 ENCOUNTER — Ambulatory Visit (INDEPENDENT_AMBULATORY_CARE_PROVIDER_SITE_OTHER): Payer: Medicare Other | Admitting: Family Medicine

## 2020-06-12 VITALS — BP 158/90 | HR 80 | Temp 98.4°F | Ht 63.0 in | Wt 151.2 lb

## 2020-06-12 DIAGNOSIS — I2510 Atherosclerotic heart disease of native coronary artery without angina pectoris: Secondary | ICD-10-CM | POA: Diagnosis not present

## 2020-06-12 DIAGNOSIS — H409 Unspecified glaucoma: Secondary | ICD-10-CM | POA: Diagnosis not present

## 2020-06-12 DIAGNOSIS — Z23 Encounter for immunization: Secondary | ICD-10-CM | POA: Diagnosis not present

## 2020-06-12 DIAGNOSIS — M810 Age-related osteoporosis without current pathological fracture: Secondary | ICD-10-CM | POA: Diagnosis not present

## 2020-06-12 DIAGNOSIS — K219 Gastro-esophageal reflux disease without esophagitis: Secondary | ICD-10-CM

## 2020-06-12 DIAGNOSIS — N1831 Chronic kidney disease, stage 3a: Secondary | ICD-10-CM

## 2020-06-12 DIAGNOSIS — I10 Essential (primary) hypertension: Secondary | ICD-10-CM

## 2020-06-12 DIAGNOSIS — H9113 Presbycusis, bilateral: Secondary | ICD-10-CM | POA: Diagnosis not present

## 2020-06-12 DIAGNOSIS — E785 Hyperlipidemia, unspecified: Secondary | ICD-10-CM

## 2020-06-12 MED ORDER — METOPROLOL SUCCINATE ER 25 MG PO TB24: 25.0000 mg | ORAL_TABLET | Freq: Every day | ORAL | 3 refills | Status: DC

## 2020-06-12 MED ORDER — OMEPRAZOLE 20 MG PO CPDR: 1.0000 | DELAYED_RELEASE_CAPSULE | Freq: Every day | ORAL | 3 refills | Status: DC

## 2020-06-12 MED ORDER — LOSARTAN POTASSIUM-HCTZ 50-12.5 MG PO TABS: 1.0000 | ORAL_TABLET | Freq: Every day | ORAL | 3 refills | Status: DC

## 2020-06-12 MED ORDER — ATORVASTATIN CALCIUM 10 MG PO TABS: 10.0000 mg | ORAL_TABLET | Freq: Every day | ORAL | 3 refills | Status: DC

## 2020-06-12 MED ORDER — AMLODIPINE BESYLATE 5 MG PO TABS: ORAL_TABLET | ORAL | 3 refills | Status: DC

## 2020-06-12 NOTE — Patient Instructions (Signed)
  Ms. Toops , Thank you for taking time to come for your Medicare Wellness Visit. I appreciate your ongoing commitment to your health goals. Please review the following plan we discussed and let me know if I can assist you in the future.   These are the goals we discussed:  Goals   None     This is a list of the screening recommended for you and due dates:  Health Maintenance  Topic Date Due   Flu Shot  08/04/2020   Tetanus Vaccine  07/09/2029   DEXA scan (bone density measurement)  Completed   COVID-19 Vaccine  Completed   Pneumonia vaccines  Completed   Zoster (Shingles) Vaccine  Completed   Pneumococcal Vaccination  Aged Out   HPV Vaccine  Aged Out

## 2020-06-12 NOTE — Progress Notes (Signed)
Tanya Berry is a 85 y.o. female who presents for annual wellness visit and follow-up on chronic medical conditions.  She has no particular concerns or complaints.  She does have underlying ASHD and had a stent in 2001.  She has had no chest pain, shortness of breath, PND or DOE.  She does have reflux disease and uses Prilosec on an as-needed basis.  She is getting Prolia for treatment of her osteoporosis and having no difficulty with that.  She does have some arthritis especially in her knees but at this point is not interested in pursuing it further.  She continues on amlodipine and losartan/HCTZ with metoprolol.  He is also taking Lipitor and having no difficulty with that.  She does wear hearing aids.  She also has a history of CKD stage III.  She does see her ophthalmologist regularly and does have evidence of glaucoma.  Immunizations and Health Maintenance Immunization History  Administered Date(s) Administered   DTaP 03/22/2008   Fluad Quad(high Dose 65+) 10/02/2018, 10/23/2019   Influenza Split 10/27/2010, 10/27/2011   Influenza Whole 09/23/2009   Influenza, High Dose Seasonal PF 10/01/2013, 12/17/2014, 11/11/2015, 10/05/2016, 01/25/2018   Influenza,inj,Quad PF,6+ Mos 09/26/2012   PFIZER Comirnaty(Gray Top)Covid-19 Tri-Sucrose Vaccine 06/12/2020   PFIZER(Purple Top)SARS-COV-2 Vaccination 01/25/2019, 02/15/2019, 10/05/2019   Pneumococcal Conjugate-13 10/19/2004   Pneumococcal Polysaccharide-23 09/26/2012   Td 05/20/1997   Tdap 07/10/2019   Zoster Recombinat (Shingrix) 05/17/2017, 08/02/2017   Zoster, Live 01/04/2009   There are no preventive care reminders to display for this patient.   Last Pap smear: aged out  Last mammogram: 01/02/14 Last colonoscopy: over ten years ago Last DEXA: 06/26/19 Dentist: Q six months Ophtho: Q year Exercise: walking and ride bike at least 40 min QOD  Other doctors caring for patient include: N/A  Advanced directives: Does Patient Have a Medical  Advance Directive?: Yes Does patient want to make changes to medical advance directive?: No - Patient declined  Depression screen:  See questionnaire below.  Depression screen [facility] 2/9 06/12/2020 06/12/2019 06/05/2018 05/10/2017 12/23/2015  Decreased Interest 0 0 0 0 0  Down, Depressed, Hopeless 0 0 0 0 0  PHQ - 2 Score 0 0 0 0 0    Fall Risk Screen: see questionnaire below. Fall Risk  06/12/2020 06/12/2019 06/05/2018 05/10/2017 12/09/2016  Falls in the past year? 0 0 0 No No  Comment - - - - Emmi Telephone Survey: data to providers prior to load  Number falls in past yr: 0 - - - -  Injury with Fall? 0 - - - -  Risk for fall due to : No Fall Risks - - - -  Follow up Falls evaluation completed - - - -    ADL screen:  See questionnaire below Functional Status Survey: Is the patient deaf or have difficulty hearing?: No Does the patient have difficulty seeing, even when wearing glasses/contacts?: No Does the patient have difficulty concentrating, remembering, or making decisions?: No Does the patient have difficulty walking or climbing stairs?: No Does the patient have difficulty dressing or bathing?: No Does the patient have difficulty doing errands alone such as visiting a doctor's office or shopping?: No   Review of Systems Constitutional: -, -unexpected weight change, -anorexia, -fatigue Allergy: -sneezing, -itching, -congestion Dermatology: denies changing moles, rash, lumps ENT: -runny nose, -ear pain, -sore throat,  Cardiology:  -chest pain, -palpitations, -orthopnea, Respiratory: -cough, -shortness of breath, -dyspnea on exertion, -wheezing,  Gastroenterology: -abdominal pain, -nausea, -vomiting, -diarrhea, -constipation, -dysphagia Hematology: -  bleeding or bruising problems Musculoskeletal: -arthralgias, -myalgias, -joint swelling, -back pain, - Ophthalmology: -vision changes,  Urology: -dysuria, -difficulty urinating,  -urinary frequency, -urgency, incontinence Neurology: -, -numbness,  , -memory loss, -falls, -dizziness    PHYSICAL EXAM:   General Appearance: Alert, cooperative, no distress, appears stated age Head: Normocephalic, without obvious abnormality, atraumatic Eyes: PERRL, conjunctiva/corneas clear, EOM's intact,  Ears: Normal TM's and external ear canals Nose: Nares normal, mucosa normal, no drainage or sinus tenderness Throat: Lips, mucosa, and tongue normal; teeth and gums normal Neck: Supple, no lymphadenopathy;  thyroid:  no enlargement/tenderness/nodules; no carotid bruit or JVD Lungs: Clear to auscultation bilaterally without wheezes, rales or ronchi; respirations unlabored Heart: Regular rate and rhythm, S1 and S2 normal, no murmur, rubor gallop Abdomen: Soft, non-tender, nondistended, normoactive bowel sounds,  no masses, no hepatosplenomegaly  Skin:  Skin color, texture, turgor normal, no rashes or lesions Lymph nodes: Cervical, supraclavicular, and axillary nodes normal Neurologic:  CNII-XII intact, normal strength, sensation and gait; reflexes 2+ and symmetric throughout Psych: Normal mood, affect, hygiene and grooming.  ASSESSMENT/PLAN: ASHD (arteriosclerotic heart disease) - Plan: PFIZER Comirnaty(GRAY TOP)COVID-19 Vaccine, CBC with Differential/Platelet, Comprehensive metabolic panel, metoprolol succinate (TOPROL XL) 25 MG 24 hr tablet  Primary hypertension - Plan: CBC with Differential/Platelet, Comprehensive metabolic panel  Hyperlipidemia with target LDL less than 70 - Plan: Lipid panel, atorvastatin (LIPITOR) 10 MG tablet  Age-related osteoporosis without current pathological fracture  Gastroesophageal reflux disease without esophagitis - Plan: omeprazole (PRILOSEC) 20 MG capsule  Presbycusis of both ears  Glaucoma of both eyes, unspecified glaucoma type  Stage 3a chronic kidney disease (HCC)  Immunization, viral disease - Plan: PFIZER Comirnaty(GRAY TOP)COVID-19 Vaccine  Essential hypertension - Plan: metoprolol succinate  (TOPROL XL) 25 MG 24 hr tablet, losartan-hydrochlorothiazide (HYZAAR) 50-12.5 MG tablet, amLODipine (NORVASC) 5 MG tablet She will continue on her present medication regimen.  Encouraged her to stay as physically active as possible.   Discussed at least 30 minutes of aerobic activity at least 5 days/week and weight-bearing exercise 2x/week;  healthy diet, including goals of calcium and vitamin D intake a  Immunization recommendations discussed.  Colonoscopy recommendations reviewed   Medicare Attestation I have personally reviewed: The patient's medical and social history Their use of alcohol, tobacco or illicit drugs Their current medications and supplements The patient's functional ability including ADLs,fall risks, home safety risks, cognitive, and hearing and visual impairment Diet and physical activities Evidence for depression or mood disorders  The patient's weight, height, and BMI have been recorded in the chart.  I have made referrals, counseling, and provided education to the patient based on review of the above and I have provided the patient with a written personalized care plan for preventive services.     Jill Alexanders, MD   06/12/2020

## 2020-06-13 LAB — COMPREHENSIVE METABOLIC PANEL
ALT: 22 IU/L (ref 0–32)
AST: 22 IU/L (ref 0–40)
Albumin/Globulin Ratio: 1.9 (ref 1.2–2.2)
Albumin: 4.7 g/dL — ABNORMAL HIGH (ref 3.6–4.6)
Alkaline Phosphatase: 60 IU/L (ref 44–121)
BUN/Creatinine Ratio: 14 (ref 12–28)
BUN: 15 mg/dL (ref 8–27)
Bilirubin Total: 0.5 mg/dL (ref 0.0–1.2)
CO2: 23 mmol/L (ref 20–29)
Calcium: 9.6 mg/dL (ref 8.7–10.3)
Chloride: 101 mmol/L (ref 96–106)
Creatinine, Ser: 1.05 mg/dL — ABNORMAL HIGH (ref 0.57–1.00)
Globulin, Total: 2.5 g/dL (ref 1.5–4.5)
Glucose: 103 mg/dL — ABNORMAL HIGH (ref 65–99)
Potassium: 4.7 mmol/L (ref 3.5–5.2)
Sodium: 139 mmol/L (ref 134–144)
Total Protein: 7.2 g/dL (ref 6.0–8.5)
eGFR: 53 mL/min/{1.73_m2} — ABNORMAL LOW (ref >59)

## 2020-06-13 LAB — LIPID PANEL
Chol/HDL Ratio: 2.9 ratio (ref 0.0–4.4)
Cholesterol, Total: 211 mg/dL — ABNORMAL HIGH (ref 100–199)
HDL: 73 mg/dL (ref >39)
LDL Chol Calc (NIH): 115 mg/dL — ABNORMAL HIGH (ref 0–99)
Triglycerides: 132 mg/dL (ref 0–149)
VLDL Cholesterol Cal: 23 mg/dL (ref 5–40)

## 2020-06-13 LAB — CBC WITH DIFFERENTIAL/PLATELET
Basophils Absolute: 0 10*3/uL (ref 0.0–0.2)
Basos: 1 %
EOS (ABSOLUTE): 0.1 10*3/uL (ref 0.0–0.4)
Eos: 3 %
Hematocrit: 44.4 % (ref 34.0–46.6)
Hemoglobin: 14.8 g/dL (ref 11.1–15.9)
Immature Grans (Abs): 0 10*3/uL (ref 0.0–0.1)
Immature Granulocytes: 0 %
Lymphocytes Absolute: 1.1 10*3/uL (ref 0.7–3.1)
Lymphs: 23 %
MCH: 28.7 pg (ref 26.6–33.0)
MCHC: 33.3 g/dL (ref 31.5–35.7)
MCV: 86 fL (ref 79–97)
Monocytes Absolute: 0.6 10*3/uL (ref 0.1–0.9)
Monocytes: 11 %
Neutrophils Absolute: 3 10*3/uL (ref 1.4–7.0)
Neutrophils: 62 %
Platelets: 216 10*3/uL (ref 150–450)
RBC: 5.15 x10E6/uL (ref 3.77–5.28)
RDW: 13.6 % (ref 11.7–15.4)
WBC: 4.8 10*3/uL (ref 3.4–10.8)

## 2020-06-13 NOTE — Progress Notes (Signed)
Pt was advise KH 

## 2020-08-21 ENCOUNTER — Other Ambulatory Visit (INDEPENDENT_AMBULATORY_CARE_PROVIDER_SITE_OTHER): Payer: Medicare Other

## 2020-08-21 ENCOUNTER — Other Ambulatory Visit: Payer: Self-pay

## 2020-08-21 DIAGNOSIS — M810 Age-related osteoporosis without current pathological fracture: Secondary | ICD-10-CM | POA: Diagnosis not present

## 2020-08-21 MED ORDER — DENOSUMAB 60 MG/ML ~~LOC~~ SOSY
60.0000 mg | PREFILLED_SYRINGE | Freq: Once | SUBCUTANEOUS | Status: AC
  Administered 2020-08-21: 60 mg via SUBCUTANEOUS

## 2020-09-04 DIAGNOSIS — Z20822 Contact with and (suspected) exposure to covid-19: Secondary | ICD-10-CM | POA: Diagnosis not present

## 2020-10-28 ENCOUNTER — Other Ambulatory Visit: Payer: Self-pay

## 2020-10-28 ENCOUNTER — Other Ambulatory Visit (INDEPENDENT_AMBULATORY_CARE_PROVIDER_SITE_OTHER): Payer: Medicare Other

## 2020-10-28 DIAGNOSIS — Z23 Encounter for immunization: Secondary | ICD-10-CM | POA: Diagnosis not present

## 2020-11-06 ENCOUNTER — Other Ambulatory Visit: Payer: Medicare Other

## 2021-02-18 ENCOUNTER — Telehealth: Payer: Self-pay | Admitting: Family Medicine

## 2021-02-18 NOTE — Telephone Encounter (Signed)
Left message with pt's spouse to have her call me back concerning her next Prolia injection. PT estimated cost is $0 and needs to be scheduled after 02/21/2021. Needs to be ordered.

## 2021-02-25 ENCOUNTER — Other Ambulatory Visit (INDEPENDENT_AMBULATORY_CARE_PROVIDER_SITE_OTHER): Payer: Medicare Other

## 2021-02-25 ENCOUNTER — Other Ambulatory Visit: Payer: Self-pay

## 2021-02-25 DIAGNOSIS — M810 Age-related osteoporosis without current pathological fracture: Secondary | ICD-10-CM | POA: Diagnosis not present

## 2021-02-25 MED ORDER — DENOSUMAB 60 MG/ML ~~LOC~~ SOSY: 60.0000 mg | PREFILLED_SYRINGE | Freq: Once | SUBCUTANEOUS | Status: DC

## 2021-02-25 MED ORDER — DENOSUMAB 60 MG/ML ~~LOC~~ SOSY
60.0000 mg | PREFILLED_SYRINGE | Freq: Once | SUBCUTANEOUS | Status: AC
  Administered 2021-02-25: 60 mg via SUBCUTANEOUS

## 2021-02-25 NOTE — Addendum Note (Signed)
Addended by: Elyse Jarvis on: 02/25/2021 04:30 PM   Modules accepted: Orders

## 2021-06-08 DIAGNOSIS — H35372 Puckering of macula, left eye: Secondary | ICD-10-CM | POA: Diagnosis not present

## 2021-06-08 DIAGNOSIS — Z961 Presence of intraocular lens: Secondary | ICD-10-CM | POA: Diagnosis not present

## 2021-06-08 DIAGNOSIS — H04123 Dry eye syndrome of bilateral lacrimal glands: Secondary | ICD-10-CM | POA: Diagnosis not present

## 2021-06-08 DIAGNOSIS — H401131 Primary open-angle glaucoma, bilateral, mild stage: Secondary | ICD-10-CM | POA: Diagnosis not present

## 2021-06-09 ENCOUNTER — Other Ambulatory Visit: Payer: Self-pay

## 2021-06-09 ENCOUNTER — Telehealth: Payer: Self-pay

## 2021-06-09 DIAGNOSIS — E785 Hyperlipidemia, unspecified: Secondary | ICD-10-CM

## 2021-06-09 DIAGNOSIS — I2510 Atherosclerotic heart disease of native coronary artery without angina pectoris: Secondary | ICD-10-CM

## 2021-06-09 DIAGNOSIS — I10 Essential (primary) hypertension: Secondary | ICD-10-CM

## 2021-06-09 DIAGNOSIS — K219 Gastro-esophageal reflux disease without esophagitis: Secondary | ICD-10-CM

## 2021-06-09 MED ORDER — ATORVASTATIN CALCIUM 10 MG PO TABS: 10.0000 mg | ORAL_TABLET | Freq: Every day | ORAL | 0 refills | Status: DC

## 2021-06-09 MED ORDER — AMLODIPINE BESYLATE 5 MG PO TABS: ORAL_TABLET | ORAL | 0 refills | Status: DC

## 2021-06-09 MED ORDER — METOPROLOL SUCCINATE ER 25 MG PO TB24: 25.0000 mg | ORAL_TABLET | Freq: Every day | ORAL | 0 refills | Status: DC

## 2021-06-09 MED ORDER — LOSARTAN POTASSIUM-HCTZ 50-12.5 MG PO TABS: 1.0000 | ORAL_TABLET | Freq: Every day | ORAL | 0 refills | Status: DC

## 2021-06-09 MED ORDER — OMEPRAZOLE 20 MG PO CPDR: 20.0000 mg | DELAYED_RELEASE_CAPSULE | Freq: Every day | ORAL | 0 refills | Status: DC

## 2021-06-09 NOTE — Telephone Encounter (Signed)
Pt. Called stating she had to reschedule her CPE due to a death in the family to October 23, 2021. But she does need her meds refilled to [facility] omeprazole, metoprolol, losartan/hctz, atorvastatin, and amlodipine.

## 2021-06-09 NOTE — Telephone Encounter (Signed)
Done KH 

## 2021-06-16 ENCOUNTER — Ambulatory Visit: Payer: Medicare Other | Admitting: Family Medicine

## 2021-06-17 ENCOUNTER — Telehealth: Payer: Self-pay | Admitting: Family Medicine

## 2021-06-17 NOTE — Telephone Encounter (Signed)
Left message for patient to call back and schedule Medicare Annual Wellness Visit (AWV) either virtually or in office. I left my number for patient to call [phone].  Last AWV  06/12/20 ; please schedule at anytime with health coach

## 2021-06-26 ENCOUNTER — Ambulatory Visit (INDEPENDENT_AMBULATORY_CARE_PROVIDER_SITE_OTHER): Payer: Medicare Other

## 2021-06-26 VITALS — Ht 62.0 in | Wt 150.0 lb

## 2021-06-26 DIAGNOSIS — Z0000 Encounter for general adult medical examination without abnormal findings: Secondary | ICD-10-CM | POA: Diagnosis not present

## 2021-06-26 NOTE — Progress Notes (Signed)
I connected with Simrit Cawley today by telephone and verified that I am speaking with the correct person using two identifiers. Location patient: home Location provider: work Persons participating in the virtual visit: Cherie, Leisy LPN.   I discussed the limitations, risks, security and privacy concerns of performing an evaluation and management service by telephone and the availability of in person appointments. I also discussed with the patient that there may be a patient responsible charge related to this service. The patient expressed understanding and verbally consented to this telephonic visit.    Interactive audio and video telecommunications were attempted between this provider and patient, however failed, due to patient having technical difficulties OR patient did not have access to video capability.  We continued and completed visit with audio only.     Vital signs may be patient reported or missing.  Subjective:   Tanya Berry is a 86 y.o. female who presents for Medicare Annual (Subsequent) preventive examination.  Review of Systems     Cardiac Risk Factors include: advanced age (>47men, >68 women);dyslipidemia;hypertension     Objective:    Today's Vitals   06/26/21 0811  Weight: 150 lb (68 kg)  Height: 5\' 2"  (1.575 m)   Body mass index is 27.44 kg/m.     06/26/2021    8:15 AM 06/12/2020    9:37 AM 06/12/2019    9:53 AM 06/05/2018   11:31 AM 05/10/2017   10:55 AM 12/23/2015    9:15 AM 12/17/2014   10:22 AM  Advanced Directives  Does Patient Have a Medical Advance Directive? Yes Yes Yes Yes Yes Yes Yes  Type of Estate agent of [place];Living will  Healthcare Power of [place];Living will Healthcare Power of [place];Living will Healthcare Power of [place];Living will  Healthcare Power of [place];Living will  Does patient want to make changes to medical advance directive?  No - Patient declined No - Patient declined No - Patient  declined No - Patient declined No - Patient declined   Copy of Healthcare Power of Attorney in Chart? Yes - validated most recent copy scanned in chart (See row information)  No - copy requested Yes - validated most recent copy scanned in chart (See row information) Yes  No - copy requested    Current Medications (verified) Outpatient Encounter Medications as of 06/26/2021  Medication Sig   amLODipine (NORVASC) 5 MG tablet TAKE 1 TABLET DAILY (CALL TO MAKE APPOINTMENT)   aspirin 81 MG EC tablet Take 81 mg by mouth daily.     atorvastatin (LIPITOR) 10 MG tablet Take 1 tablet (10 mg total) by mouth daily.   Calcium Carb-Cholecalciferol 200-10 MG-MCG CAPS Take by mouth.   cholecalciferol (VITAMIN D) 1000 UNITS tablet Take 1,000 Units by mouth daily.    Cyanocobalamin (B-12 PO) Take by mouth.   Glucosamine HCl (GLUCOSAMINE PO) Take by mouth.   latanoprost (XALATAN) 0.005 % ophthalmic solution 1 drop at bedtime.   losartan-hydrochlorothiazide (HYZAAR) 50-12.5 MG tablet Take 1 tablet by mouth daily.   metoprolol succinate (TOPROL XL) 25 MG 24 hr tablet Take 1 tablet (25 mg total) by mouth daily.   Multiple Vitamins-Minerals (OSTEO COMPLEX PO) Take 1 tablet by mouth 1 dose over 46 hours.     omeprazole (PRILOSEC) 20 MG capsule Take 1 capsule (20 mg total) by mouth daily.   No facility-administered encounter medications on file as of 06/26/2021.    Allergies (verified) Patient has no known allergies.   History: Past Medical History:  Diagnosis Date   Allergy seasonal rhinitis   ASHD (arteriosclerotic heart disease) 2002   angioplasty   Diverticulosis    Dyslipidemia    GERD (gastroesophageal reflux disease)    Hearing loss, sensorineural, high frequency    Hemorrhoid    Hypertension    Menopause    Osteopenia    Past Surgical History:  Procedure Laterality Date   COLONOSCOPY  2003   History reviewed. No pertinent family history. Social History   Socioeconomic History   Marital  status: Married    Spouse name: Not on file   Number of children: Not on file   Years of education: Not on file   Highest education level: Not on file  Occupational History   Not on file  Tobacco Use   Smoking status: Never   Smokeless tobacco: Never  Vaping Use   Vaping Use: Never used  Substance and Sexual Activity   Alcohol use: No   Drug use: No   Sexual activity: Not Currently  Other Topics Concern   Not on file  Social History Narrative   Not on file   Social Determinants of Health   Financial Resource Strain: Low Risk  (06/26/2021)   Overall Financial Resource Strain (CARDIA)    Difficulty of Paying Living Expenses: Not hard at all  Food Insecurity: No Food Insecurity (06/26/2021)   Hunger Vital Sign    Worried About Running Out of Food in the Last Year: Never true    Ran Out of Food in the Last Year: Never true  Transportation Needs: No Transportation Needs (06/26/2021)   PRAPARE - Administrator, Civil Service (Medical): No    Lack of Transportation (Non-Medical): No  Physical Activity: Insufficiently Active (06/26/2021)   Exercise Vital Sign    Days of Exercise per Week: 3 days    Minutes of Exercise per Session: 20 min  Stress: No Stress Concern Present (06/26/2021)   [employer] of Occupational Health - Occupational Stress Questionnaire    Feeling of Stress : Not at all  Social Connections: Not on file    Tobacco Counseling Counseling given: Not Answered   Clinical Intake:  Pre-visit preparation completed: Yes  Pain : No/denies pain     Nutritional Status: BMI 25 -29 Overweight Nutritional Risks: None Diabetes: No  How often do you need to have someone help you when you read instructions, pamphlets, or other written materials from your doctor or pharmacy?: 1 - Never What is the last grade level you completed in school?: some college  Diabetic?no  Interpreter Needed?: No  Information entered by :: NAllen LPN   Activities of  Daily Living    06/26/2021    8:17 AM  In your present state of health, do you have any difficulty performing the following activities:  Hearing? 0  Comment has hearing aids  Vision? 0  Difficulty concentrating or making decisions? 0  Walking or climbing stairs? 0  Dressing or bathing? 0  Doing errands, shopping? 0  Preparing Food and eating ? N  Using the Toilet? N  In the past six months, have you accidently leaked urine? Y  Do you have problems with loss of bowel control? N  Managing your Medications? N  Managing your Finances? N  Housekeeping or managing your Housekeeping? N    Patient Care Team: [name], MD as PCP - General  Indicate any recent Medical Services you may have received from other than [facility] providers in the past year (  date may be approximate).     Assessment:   This is a routine wellness examination for Tanya Berry.  Hearing/Vision screen Vision Screening - Comments:: Regular eye exams, [facility]  Dietary issues and exercise activities discussed: Current Exercise Habits: Home exercise routine, Type of exercise: Other - see comments (stationary bike), Time (Minutes): 15, Frequency (Times/Week): 3, Weekly Exercise (Minutes/Week): 45   Goals Addressed             This Visit's Progress    Patient Stated       06/26/2021, be more active       Depression Screen    06/26/2021    8:17 AM 06/12/2020    9:38 AM 06/12/2019    9:37 AM 06/05/2018   11:07 AM 05/10/2017   10:21 AM 12/23/2015    8:46 AM 12/17/2014   10:22 AM  PHQ 2/9 Scores  PHQ - 2 Score 0 0 0 0 0 0 0    Fall Risk    06/26/2021    8:17 AM 06/12/2020    9:38 AM 06/12/2019    9:37 AM 06/05/2018   11:07 AM 05/10/2017   10:21 AM  Fall Risk   Falls in the past year? 0 0 0 0 No  Number falls in past yr: 0 0     Injury with Fall? 0 0     Risk for fall due to : Medication side effect No Fall Risks     Follow up Falls evaluation completed;Education provided;Falls prevention discussed Falls  evaluation completed       FALL RISK PREVENTION PERTAINING TO THE HOME:  Any stairs in or around the home? Yes  If so, are there any without handrails? No  Home free of loose throw rugs in walkways, pet beds, electrical cords, etc? Yes  Adequate lighting in your home to reduce risk of falls? Yes   ASSISTIVE DEVICES UTILIZED TO PREVENT FALLS:  Life alert? No  Use of a cane, walker or w/c? No  Grab bars in the bathroom? Yes  Shower chair or bench in shower? No  Elevated toilet seat or a handicapped toilet? Yes   TIMED UP AND GO:  Was the test performed? No .      Cognitive Function:        06/26/2021    8:19 AM  6CIT Screen  What Year? 0 points  What month? 0 points  What time? 0 points  Count back from 20 2 points  Months in reverse 0 points  Repeat phrase 0 points  Total Score 2 points    Immunizations Immunization History  Administered Date(s) Administered   DTaP 03/22/2008   Fluad Quad(high Dose 65+) 10/02/2018, 10/23/2019, 10/28/2020   Influenza Split 10/27/2010, 10/27/2011   Influenza Whole 09/23/2009   Influenza, High Dose Seasonal PF 10/01/2013, 12/17/2014, 11/11/2015, 10/05/2016, 01/25/2018   Influenza,inj,Quad PF,6+ Mos 09/26/2012   PFIZER Comirnaty(Gray Top)Covid-19 Tri-Sucrose Vaccine 06/12/2020   PFIZER(Purple Top)SARS-COV-2 Vaccination 01/25/2019, 02/15/2019, 10/05/2019   Pneumococcal Conjugate-13 10/19/2004   Pneumococcal Polysaccharide-23 09/26/2012   Td 05/20/1997   Tdap 07/10/2019   Zoster Recombinat (Shingrix) 05/17/2017, 08/02/2017   Zoster, Live 01/04/2009    TDAP status: Up to date  Flu Vaccine status: Up to date  Pneumococcal vaccine status: Up to date  Covid-19 vaccine status: Completed vaccines  Qualifies for Shingles Vaccine? Yes   Zostavax completed Yes   Shingrix Completed?: Yes  Screening Tests Health Maintenance  Topic Date Due   COVID-19 Vaccine (5 Water engineer series)  08/07/2020   INFLUENZA VACCINE  08/04/2021    TETANUS/TDAP  07/09/2029   Pneumonia Vaccine 76+ Years old  Completed   DEXA SCAN  Completed   Zoster Vaccines- Shingrix  Completed   HPV VACCINES  Aged Out    Health Maintenance  Health Maintenance Due  Topic Date Due   COVID-19 Vaccine (5 - Pfizer series) 08/07/2020    Colorectal cancer screening: No longer required.   Mammogram status: No longer required due to age.  Bone Density status: Completed 06/26/2019.   Lung Cancer Screening: (Low Dose CT Chest recommended if Age 84-80 years, 30 pack-year currently smoking OR have quit w/in 15years.) does not qualify.   Lung Cancer Screening Referral: no  Additional Screening:  Hepatitis C Screening: does not qualify;   Vision Screening: Recommended annual ophthalmology exams for early detection of glaucoma and other disorders of the eye. Is the patient up to date with their annual eye exam?  Yes  Who is the provider or what is the name of the office in which the patient attends annual eye exams? [facility] If pt is not established with a provider, would they like to be referred to a provider to establish care? No .   Dental Screening: Recommended annual dental exams for proper oral hygiene  Community Resource Referral / Chronic Care Management: CRR required this visit?  No   CCM required this visit?  No      Plan:     I have personally reviewed and noted the following in the patient's chart:   Medical and social history Use of alcohol, tobacco or illicit drugs  Current medications and supplements including opioid prescriptions.  Functional ability and status Nutritional status Physical activity Advanced directives List of other physicians Hospitalizations, surgeries, and ER visits in previous 12 months Vitals Screenings to include cognitive, depression, and falls Referrals and appointments  In addition, I have reviewed and discussed with patient certain preventive protocols, quality metrics, and best  practice recommendations. A written personalized care plan for preventive services as well as general preventive health recommendations were provided to patient.     Barb Merino, LPN   09/02/5619   Nurse Notes: none  Due to this being a virtual visit, the after visit summary with patients personalized plan was offered to patient via mail or my-chart.  Patient preferred to pick up at office at next visit

## 2021-08-25 ENCOUNTER — Telehealth: Payer: Self-pay | Admitting: Family Medicine

## 2021-08-25 NOTE — Telephone Encounter (Signed)
Left message for pt to call concerning Prolia .  

## 2021-08-26 ENCOUNTER — Telehealth: Payer: Self-pay | Admitting: Family Medicine

## 2021-08-26 NOTE — Telephone Encounter (Signed)
Pt called concerning next Prolia injection. Pt advised that her estimated cost is $0. Pt has a MWV already scheduled for 09/08/2021. Since that is so close injection was added to that appointment. Pt advised to call while leaving so medication can be set out. Pt verbalized understanding. Medication will be ordered form physician services.

## 2021-08-31 ENCOUNTER — Other Ambulatory Visit: Payer: Self-pay | Admitting: Family Medicine

## 2021-08-31 DIAGNOSIS — E785 Hyperlipidemia, unspecified: Secondary | ICD-10-CM

## 2021-08-31 DIAGNOSIS — I10 Essential (primary) hypertension: Secondary | ICD-10-CM

## 2021-08-31 DIAGNOSIS — K219 Gastro-esophageal reflux disease without esophagitis: Secondary | ICD-10-CM

## 2021-09-07 ENCOUNTER — Other Ambulatory Visit: Payer: Self-pay | Admitting: Family Medicine

## 2021-09-07 DIAGNOSIS — I2510 Atherosclerotic heart disease of native coronary artery without angina pectoris: Secondary | ICD-10-CM

## 2021-09-07 DIAGNOSIS — I10 Essential (primary) hypertension: Secondary | ICD-10-CM

## 2021-09-08 ENCOUNTER — Ambulatory Visit (INDEPENDENT_AMBULATORY_CARE_PROVIDER_SITE_OTHER): Payer: Medicare Other | Admitting: Family Medicine

## 2021-09-08 ENCOUNTER — Encounter: Payer: Self-pay | Admitting: Family Medicine

## 2021-09-08 VITALS — BP 162/82 | HR 71 | Temp 98.2°F | Ht 62.0 in | Wt 147.2 lb

## 2021-09-08 DIAGNOSIS — E785 Hyperlipidemia, unspecified: Secondary | ICD-10-CM

## 2021-09-08 DIAGNOSIS — K219 Gastro-esophageal reflux disease without esophagitis: Secondary | ICD-10-CM | POA: Diagnosis not present

## 2021-09-08 DIAGNOSIS — I2510 Atherosclerotic heart disease of native coronary artery without angina pectoris: Secondary | ICD-10-CM | POA: Diagnosis not present

## 2021-09-08 DIAGNOSIS — H9113 Presbycusis, bilateral: Secondary | ICD-10-CM

## 2021-09-08 DIAGNOSIS — M545 Low back pain, unspecified: Secondary | ICD-10-CM | POA: Diagnosis not present

## 2021-09-08 DIAGNOSIS — N3281 Overactive bladder: Secondary | ICD-10-CM | POA: Diagnosis not present

## 2021-09-08 DIAGNOSIS — I493 Ventricular premature depolarization: Secondary | ICD-10-CM | POA: Diagnosis not present

## 2021-09-08 DIAGNOSIS — M810 Age-related osteoporosis without current pathological fracture: Secondary | ICD-10-CM | POA: Diagnosis not present

## 2021-09-08 DIAGNOSIS — I447 Left bundle-branch block, unspecified: Secondary | ICD-10-CM | POA: Diagnosis not present

## 2021-09-08 DIAGNOSIS — N1831 Chronic kidney disease, stage 3a: Secondary | ICD-10-CM

## 2021-09-08 DIAGNOSIS — I10 Essential (primary) hypertension: Secondary | ICD-10-CM

## 2021-09-08 DIAGNOSIS — G8929 Other chronic pain: Secondary | ICD-10-CM | POA: Diagnosis not present

## 2021-09-08 MED ORDER — METOPROLOL SUCCINATE ER 25 MG PO TB24: 25.0000 mg | ORAL_TABLET | Freq: Every day | ORAL | 3 refills | Status: DC

## 2021-09-08 MED ORDER — DENOSUMAB 60 MG/ML ~~LOC~~ SOSY
60.0000 mg | PREFILLED_SYRINGE | Freq: Once | SUBCUTANEOUS | Status: AC
  Administered 2021-09-08: 60 mg via SUBCUTANEOUS

## 2021-09-08 MED ORDER — AMLODIPINE BESYLATE 5 MG PO TABS: 5.0000 mg | ORAL_TABLET | Freq: Every day | ORAL | 3 refills | Status: DC

## 2021-09-08 MED ORDER — LOSARTAN POTASSIUM-HCTZ 50-12.5 MG PO TABS: 1.0000 | ORAL_TABLET | Freq: Every day | ORAL | 3 refills | Status: DC

## 2021-09-08 MED ORDER — ATORVASTATIN CALCIUM 10 MG PO TABS: 10.0000 mg | ORAL_TABLET | Freq: Every day | ORAL | 0 refills | Status: DC

## 2021-09-08 NOTE — Patient Instructions (Signed)

## 2021-09-08 NOTE — Progress Notes (Signed)
Complete med check plus exam  Patient: Tanya Berry   DOB: May 16, 1935   86 y.o. Female  MRN: [id number]  Subjective:    Chief Complaint  Patient presents with   Annual Exam    Med check plus non fasting     Tanya Berry is a 86 y.o. female who presents today for a complete med check plus exam. She reports consuming a general and low sodium diet. Home exercise routine includes exercise bike. She generally feels well. She reports sleeping well. She does occasionally complain of some slight dizziness but no chest pain, shortness of breath, PND or DOE.  Does have a previous history of ASHD with a stent in 2001.  Also has a previous history of LBBB.  She also has a history of osteoporosis and is here for another Prolia shot.  It is also time for follow-up DEXA scan.  She is followed regularly by ophthalmology.  She also uses hearing aids.  She does have OAB and presently is she is using a pad.  Her back is giving her very little difficulty.  She continues on every other day dosing of Prilosec.  Also continues on Lipitor and having no difficulty with that. She continues on Hyzaar, Toprol and Norvasc.  Most recent fall risk assessment:    06/26/2021    8:17 AM  Fall Risk   Falls in the past year? 0  Number falls in past yr: 0  Injury with Fall? 0  Risk for fall due to : Medication side effect  Follow up Falls evaluation completed;Education provided;Falls prevention discussed     Most recent depression screenings:    06/26/2021    8:17 AM 06/12/2020    9:38 AM  PHQ 2/9 Scores  PHQ - 2 Score 0 0      Patient Active Problem List   Diagnosis Date Noted   LBBB (left bundle branch block) 06/12/2019   Stage 3a chronic kidney disease (HCC) 06/12/2019   Glaucoma of both eyes 06/05/2018   Gastroesophageal reflux disease without esophagitis 12/17/2014   Presbycusis of both ears 12/17/2014   Chronic back pain 10/01/2013   ASHD (arteriosclerotic heart disease) 08/05/2011   Hypertension  08/05/2011   Hyperlipidemia with target LDL less than 70 08/05/2011   Osteoporosis 08/05/2011   Past Medical History:  Diagnosis Date   Allergy seasonal rhinitis   ASHD (arteriosclerotic heart disease) 2002   angioplasty   Diverticulosis    Dyslipidemia    GERD (gastroesophageal reflux disease)    Hearing loss, sensorineural, high frequency    Hemorrhoid    Hypertension    Menopause    Osteopenia    Past Surgical History:  Procedure Laterality Date   COLONOSCOPY  2003   Social History   Tobacco Use   Smoking status: Never   Smokeless tobacco: Never  Vaping Use   Vaping Use: Never used  Substance Use Topics   Alcohol use: No   Drug use: No   No family history on file. No Known Allergies    Patient Care Team: [name], MD as PCP - General   Outpatient Medications Prior to Visit  Medication Sig Note   amLODipine (NORVASC) 5 MG tablet TAKE 1 TABLET DAILY (CALL TO MAKE APPOINTMENT)    aspirin 81 MG EC tablet Take 81 mg by mouth daily.      atorvastatin (LIPITOR) 10 MG tablet TAKE 1 TABLET DAILY    Calcium Carb-Cholecalciferol 200-10 MG-MCG CAPS Take by mouth.  cholecalciferol (VITAMIN D) 1000 UNITS tablet Take 1,000 Units by mouth daily.     Cyanocobalamin (B-12 PO) Take by mouth.    Glucosamine HCl (GLUCOSAMINE PO) Take by mouth.    latanoprost (XALATAN) 0.005 % ophthalmic solution 1 drop at bedtime.    losartan-hydrochlorothiazide (HYZAAR) 50-12.5 MG tablet TAKE 1 TABLET DAILY    metoprolol succinate (TOPROL-XL) 25 MG 24 hr tablet TAKE 1 TABLET DAILY    Multiple Vitamins-Minerals (OSTEO COMPLEX PO) Take 1 tablet by mouth 1 dose over 46 hours.      omeprazole (PRILOSEC) 20 MG capsule TAKE 1 CAPSULE DAILY 09/08/2021: Pt QOD   No facility-administered medications prior to visit.    Review of Systems  All other systems reviewed and are negative.         Objective:     BP (!) 160/80   Pulse 71   Temp 98.2 F (36.8 C)   Ht $R'5\' 2"'cz$  (1.575 m)   Wt  147 lb 3.2 oz (66.8 kg)   SpO2 99%   BMI 26.92 kg/m  BP Readings from Last 3 Encounters:  09/08/21 (!) 160/80  06/12/20 (!) 158/90  06/12/19 (!) 146/88   Wt Readings from Last 3 Encounters:  09/08/21 147 lb 3.2 oz (66.8 kg)  06/26/21 150 lb (68 kg)  06/12/20 151 lb 3.2 oz (68.6 kg)      Physical Exam   Alert and in no distress. Tympanic membranes and canals are not evaluated due to wax. Pharyngeal area is normal. Neck is supple without adenopathy or thyromegaly. Cardiac exam shows an irregular rhythm without murmurs or gallops. Lungs are clear to auscultation.  Abdominal exam shows no masses or tenderness.  EKG read by me shows a irregular sinus rhythm with premature ventricular contractions, left bundle block.  Last CBC Lab Results  Component Value Date   WBC 4.8 06/12/2020   HGB 14.8 06/12/2020   HCT 44.4 06/12/2020   MCV 86 06/12/2020   MCH 28.7 06/12/2020   RDW 13.6 06/12/2020   PLT 216 37/16/9678   Last metabolic panel Lab Results  Component Value Date   GLUCOSE 103 (H) 06/12/2020   NA 139 06/12/2020   K 4.7 06/12/2020   CL 101 06/12/2020   CO2 23 06/12/2020   BUN 15 06/12/2020   CREATININE 1.05 (H) 06/12/2020   EGFR 53 (L) 06/12/2020   CALCIUM 9.6 06/12/2020   PROT 7.2 06/12/2020   ALBUMIN 4.7 (H) 06/12/2020   LABGLOB 2.5 06/12/2020   AGRATIO 1.9 06/12/2020   BILITOT 0.5 06/12/2020   ALKPHOS 60 06/12/2020   AST 22 06/12/2020   ALT 22 06/12/2020   Last lipids Lab Results  Component Value Date   CHOL 211 (H) 06/12/2020   HDL 73 06/12/2020   LDLCALC 115 (H) 06/12/2020   TRIG 132 06/12/2020   CHOLHDL 2.9 06/12/2020   Last vitamin D No results found for: "25OHVITD2", "25OHVITD3", "VD25OH" Last vitamin B12 and Folate No results found for: "VITAMINB12", "FOLATE"      Assessment & Plan:    ASHD (arteriosclerotic heart disease) - Plan: metoprolol succinate (TOPROL-XL) 25 MG 24 hr tablet, CBC with Differential/Platelet, Comprehensive metabolic panel,  Lipid panel, EKG 12-Lead, Ambulatory referral to Cardiology  Primary hypertension - Plan: CBC with Differential/Platelet, Comprehensive metabolic panel  LBBB (left bundle branch block) - Plan: Ambulatory referral to Cardiology  Gastroesophageal reflux disease without esophagitis  Presbycusis of both ears  Stage 3a chronic kidney disease ([place]) - Plan: CBC with Differential/Platelet, Comprehensive metabolic panel  Chronic low back pain without sciatica, unspecified back pain laterality  Hyperlipidemia with target LDL less than 70 - Plan: atorvastatin (LIPITOR) 10 MG tablet  OAB (overactive bladder)  Essential hypertension - Plan: losartan-hydrochlorothiazide (HYZAAR) 50-12.5 MG tablet, metoprolol succinate (TOPROL-XL) 25 MG 24 hr tablet, amLODipine (NORVASC) 5 MG tablet  Age-related osteoporosis without current pathological fracture - Plan: denosumab (PROLIA) injection 60 mg  PVC's (premature ventricular contractions) - Plan: Ambulatory referral to Cardiology  Immunization History  Administered Date(s) Administered   DTaP 03/22/2008   Fluad Quad(high Dose 65+) 10/02/2018, 10/23/2019, 10/28/2020   Influenza Split 10/27/2010, 10/27/2011   Influenza Whole 09/23/2009   Influenza, High Dose Seasonal PF 10/01/2013, 12/17/2014, 11/11/2015, 10/05/2016, 01/25/2018   Influenza,inj,Quad PF,6+ Mos 09/26/2012   PFIZER Comirnaty(Gray Top)Covid-19 Tri-Sucrose Vaccine 06/12/2020   PFIZER(Purple Top)SARS-COV-2 Vaccination 01/25/2019, 02/15/2019, 10/05/2019   Pneumococcal Conjugate-13 10/19/2004   Pneumococcal Polysaccharide-23 09/26/2012   Td 05/20/1997   Tdap 07/10/2019   Zoster Recombinat (Shingrix) 05/17/2017, 08/02/2017   Zoster, Live 01/04/2009    Health Maintenance  Topic Date Due   COVID-19 Vaccine (5 - Pfizer series) 08/07/2020   INFLUENZA VACCINE  08/04/2021   TETANUS/TDAP  07/09/2029   Pneumonia Vaccine 79+ Years old  Completed   DEXA SCAN  Completed   Zoster Vaccines-  Shingrix  Completed   HPV VACCINES  Aged Out  She will continue on her present medication regimen.  DEXA scan ordered.  Follow-up with ophthalmology and she will get her hearing aids checked.  She does need cerumen removed.  Continue to use the pads for her OAB.  Continue with every other day dosing of the Prilosec.  Discussed the need for RSV, flu and COVID shots.   Problem List Items Addressed This Visit     ASHD (arteriosclerotic heart disease) - Primary   Chronic back pain   Gastroesophageal reflux disease without esophagitis   Hyperlipidemia with target LDL less than 70   Hypertension   LBBB (left bundle branch block)   Presbycusis of both ears   Stage 3a chronic kidney disease ([place])   Return in about 1 year (around 09/09/2022) for med chek plus .     Jill Alexanders, MD

## 2021-09-09 LAB — CBC WITH DIFFERENTIAL/PLATELET
Basophils Absolute: 0.1 10*3/uL (ref 0.0–0.2)
Basos: 1 %
EOS (ABSOLUTE): 0.2 10*3/uL (ref 0.0–0.4)
Eos: 3 %
Hematocrit: 44.9 % (ref 34.0–46.6)
Hemoglobin: 15 g/dL (ref 11.1–15.9)
Immature Grans (Abs): 0 10*3/uL (ref 0.0–0.1)
Immature Granulocytes: 0 %
Lymphocytes Absolute: 1.8 10*3/uL (ref 0.7–3.1)
Lymphs: 28 %
MCH: 28.7 pg (ref 26.6–33.0)
MCHC: 33.4 g/dL (ref 31.5–35.7)
MCV: 86 fL (ref 79–97)
Monocytes Absolute: 0.7 10*3/uL (ref 0.1–0.9)
Monocytes: 11 %
Neutrophils Absolute: 3.6 10*3/uL (ref 1.4–7.0)
Neutrophils: 57 %
Platelets: 256 10*3/uL (ref 150–450)
RBC: 5.22 x10E6/uL (ref 3.77–5.28)
RDW: 13.1 % (ref 11.7–15.4)
WBC: 6.3 10*3/uL (ref 3.4–10.8)

## 2021-09-09 LAB — LIPID PANEL
Chol/HDL Ratio: 2.7 ratio (ref 0.0–4.4)
Cholesterol, Total: 184 mg/dL (ref 100–199)
HDL: 67 mg/dL (ref >39)
LDL Chol Calc (NIH): 95 mg/dL (ref 0–99)
Triglycerides: 128 mg/dL (ref 0–149)
VLDL Cholesterol Cal: 22 mg/dL (ref 5–40)

## 2021-09-09 LAB — COMPREHENSIVE METABOLIC PANEL
ALT: 17 IU/L (ref 0–32)
AST: 20 IU/L (ref 0–40)
Albumin/Globulin Ratio: 1.7 (ref 1.2–2.2)
Albumin: 4.5 g/dL (ref 3.7–4.7)
Alkaline Phosphatase: 67 IU/L (ref 44–121)
BUN/Creatinine Ratio: 13 (ref 12–28)
BUN: 15 mg/dL (ref 8–27)
Bilirubin Total: 0.5 mg/dL (ref 0.0–1.2)
CO2: 21 mmol/L (ref 20–29)
Calcium: 10 mg/dL (ref 8.7–10.3)
Chloride: 99 mmol/L (ref 96–106)
Creatinine, Ser: 1.17 mg/dL — ABNORMAL HIGH (ref 0.57–1.00)
Globulin, Total: 2.7 g/dL (ref 1.5–4.5)
Glucose: 96 mg/dL (ref 70–99)
Potassium: 5 mmol/L (ref 3.5–5.2)
Sodium: 139 mmol/L (ref 134–144)
Total Protein: 7.2 g/dL (ref 6.0–8.5)
eGFR: 46 mL/min/{1.73_m2} — ABNORMAL LOW (ref >59)

## 2021-09-09 MED ORDER — ATORVASTATIN CALCIUM 40 MG PO TABS: 40.0000 mg | ORAL_TABLET | Freq: Every day | ORAL | 3 refills | Status: DC

## 2021-09-09 NOTE — Addendum Note (Signed)
Addended by: Denita Lung on: 09/09/2021 01:29 PM   Modules accepted: Orders

## 2021-09-09 NOTE — Progress Notes (Signed)
Cardiology Office Note:    Date:  09/10/2021   ID:  Tanya Berry, DOB [birth year], MRN [id number]  PCP:  Denita Lung, MD  Cardiologist:  None   Referring MD: Denita Lung, MD   Chief Complaint  Patient presents with   Coronary Artery Disease   Follow-up    Dizzy   Hyperlipidemia   Hypertension    History of Present Illness:    Tanya Berry is a 86 y.o. female with a hx of CKD 3A, left bundle branch block, primary hypertension, 2002 atherosclerotic heart disease with coronary stent (bare-metal NIR 3 mm x 12) for 95% proximal LAD with good result, low normal LV function at the time of coronary angiography premature ventricular contractions, and dizziness (chronic).  Context from primary care physician, Redmond [employer]: Problem List Items Addressed This Visit       ASHD (arteriosclerotic heart disease) - Primary    Chronic back pain    Gastroesophageal reflux disease without esophagitis    Hyperlipidemia with target LDL less than 70    Hypertension    LBBB (left bundle branch block)    Presbycusis of both ears    Stage 3a chronic kidney disease ([place])   Very pleasant elderly but young at heart 86 year old who went for her yearly follow-up with Dr. Redmond School 48 hours ago.  Her only complaint is that for 6 to 12 months she has had some mild dizziness.  These episodes are random and sometimes associated with turning her head or changing directions while walking.  She had vertigo as a teenager.  This has some similarities but is not as severe and has had no associated nausea or vomiting.  She denies palpitations, chest pain, shortness of breath, orthopnea, claudication, lower extremity edema, and syncope.  She does have physical exertional fatigue due to her lower back and legs.  This causes her to sit a lot earlier than she ordinarily would.  She used to walk but states that her legs getting tired is the main reason that she does not do much walking anymore.  She still rides on her  stationary bicycle for 5 to 10 minutes most days.  Past Medical History:  Diagnosis Date   Allergy seasonal rhinitis   ASHD (arteriosclerotic heart disease) 2002   angioplasty   Diverticulosis    Dyslipidemia    GERD (gastroesophageal reflux disease)    Hearing loss, sensorineural, high frequency    Hemorrhoid    Hypertension    Menopause    Osteopenia     Past Surgical History:  Procedure Laterality Date   COLONOSCOPY  2003    Current Medications: Current Meds  Medication Sig   amLODipine (NORVASC) 5 MG tablet Take 1 tablet (5 mg total) by mouth daily.   aspirin 81 MG EC tablet Take 81 mg by mouth daily.     atorvastatin (LIPITOR) 40 MG tablet Take 1 tablet (40 mg total) by mouth daily.   Calcium Carb-Cholecalciferol 200-10 MG-MCG CAPS Take by mouth.   cholecalciferol (VITAMIN D) 1000 UNITS tablet Take 1,000 Units by mouth daily.    Cyanocobalamin (B-12 PO) Take by mouth.   Glucosamine HCl (GLUCOSAMINE PO) Take by mouth.   latanoprost (XALATAN) 0.005 % ophthalmic solution 1 drop at bedtime.   losartan-hydrochlorothiazide (HYZAAR) 50-12.5 MG tablet Take 1 tablet by mouth daily.   metoprolol succinate (TOPROL-XL) 25 MG 24 hr tablet Take 1 tablet (25 mg total) by mouth daily.   Multiple Vitamins-Minerals (OSTEO COMPLEX PO) Take 1  tablet by mouth 1 dose over 46 hours.     omeprazole (PRILOSEC) 20 MG capsule TAKE 1 CAPSULE DAILY     Allergies:   Patient has no known allergies.   Social History   Socioeconomic History   Marital status: Married    Spouse name: Not on file   Number of children: Not on file   Years of education: Not on file   Highest education level: Not on file  Occupational History   Not on file  Tobacco Use   Smoking status: Never   Smokeless tobacco: Never  Vaping Use   Vaping Use: Never used  Substance and Sexual Activity   Alcohol use: No   Drug use: No   Sexual activity: Not Currently  Other Topics Concern   Not on file  Social History  Narrative   Not on file   Social Determinants of Health   Financial Resource Strain: Low Risk  (06/26/2021)   Overall Financial Resource Strain (CARDIA)    Difficulty of Paying Living Expenses: Not hard at all  Food Insecurity: No Food Insecurity (06/26/2021)   Hunger Vital Sign    Worried About Running Out of Food in the Last Year: Never true    [place] in the Last Year: Never true  Transportation Needs: No Transportation Needs (06/26/2021)   PRAPARE - Hydrologist (Medical): No    Lack of Transportation (Non-Medical): No  Physical Activity: Insufficiently Active (06/26/2021)   Exercise Vital Sign    Days of Exercise per Week: 3 days    Minutes of Exercise per Session: 20 min  Stress: No Stress Concern Present (06/26/2021)   [place]    Feeling of Stress : Not at all  Social Connections: Not on file     Family History: The patient's family history is not on file.  ROS:   Please see the history of present illness.    Chronic low back discomfort.  Legs give out when she stirs around.  All other systems reviewed and are negative.  EKGs/Labs/Other Studies Reviewed:    The following studies were reviewed today: No recent cardiac evaluation  EKG:  EKG performed September 08, 2021 reveals left bundle branch block, normal sinus rhythm, QRSD and PVCs.  Recent Labs: 09/08/2021: ALT 17; BUN 15; Creatinine, Ser 1.17; Hemoglobin 15.0; Platelets 256; Potassium 5.0; Sodium 139  Recent Lipid Panel    Component Value Date/Time   CHOL 184 09/08/2021 1538   TRIG 128 09/08/2021 1538   HDL 67 09/08/2021 1538   CHOLHDL 2.7 09/08/2021 1538   CHOLHDL 2.5 12/23/2015 0830   VLDL 17 12/23/2015 0830   LDLCALC 95 09/08/2021 1538    Physical Exam:    VS:  BP 138/68   Pulse 85   Ht '5\' 2"'$  (1.575 m)   Wt 148 lb 9.6 oz (67.4 kg)   SpO2 95%   BMI 27.18 kg/m     Wt Readings from Last 3  Encounters:  09/10/21 148 lb 9.6 oz (67.4 kg)  09/08/21 147 lb 3.2 oz (66.8 kg)  06/26/21 150 lb (68 kg)     GEN: Appears younger than stated age. No acute distress HEENT: Normal NECK: No JVD. LYMPHATICS: No lymphadenopathy CARDIAC: 1-2/6 right upper sternal systolic murmur. RRR no gallop, or edema. VASCULAR:  Normal Pulses. No bruits. RESPIRATORY:  Clear to auscultation without rales, wheezing or rhonchi  ABDOMEN: Soft, non-tender, non-distended, No pulsatile  mass, MUSCULOSKELETAL: No deformity  SKIN: Warm and dry NEUROLOGIC:  Alert and oriented x 3 PSYCHIATRIC:  Normal affect   ASSESSMENT:    1. Premature ventricular contractions   2. ASHD (arteriosclerotic heart disease)   3. LBBB (left bundle branch block)   4. Primary hypertension   5. Hyperlipidemia with target LDL less than 70   6. Stage 3a chronic kidney disease (HCC)    PLAN:    In order of problems listed above:  I believe this is a concern of Dr. Redmond School.  In conjunction with the "dizziness" perhaps the concern was that she is having significant arrhythmia that is causing the dizziness.  My clinical assessment is that the dizziness is chronic and possibly related to middle ear issues.  We will do a 2-week monitor to quantitate PVC burden and also exclude bradycardia tachycardia that could cause dizziness episodes.  A 2D Doppler echocardiogram will be done as well to assess LV function. No chest discomfort to suggest angina.  Secondary prevention should be maintained. Chronic dating back to 2014. Controlled today.  Continue same medications as list.  Continue Toprol-XL and Hyzaar at current dose.  Also on amlodipine. Continue Lipitor.  Target LDL less than 70.  Recent LDL done 2 days ago was 102.  Should probably increase Lipitor to 80 mg a day or add ezetimibe. Did not discuss   Overall education and awareness concerning secondary risk prevention was discussed in detail: LDL less than 70, hemoglobin A1c less than 7,  blood pressure target less than 130/80 mmHg, >150 minutes of moderate aerobic activity per week, avoidance of smoking, weight control (via diet and exercise), and continued surveillance/management of/for obstructive sleep apnea.    Medication Adjustments/Labs and Tests Ordered: Current medicines are reviewed at length with the patient today.  Concerns regarding medicines are outlined above.  Orders Placed This Encounter  Procedures   LONG TERM MONITOR (3-14 DAYS)   ECHOCARDIOGRAM COMPLETE   No orders of the defined types were placed in this encounter.   Patient Instructions  Medication Instructions:  Your physician recommends that you continue on your current medications as directed. Please refer to the Current Medication list given to you today.  *If you need a refill on your cardiac medications before your next appointment, please call your pharmacy*  Lab Work: NONE  Testing/Procedures: Your physician has requested you wear a Zio heart monitor for 2 weeks. This will be mailed to your home with instructions on how to apply the monitor and how to return it when finished.  Your physician has requested that you have an echocardiogram. Echocardiography is a painless test that uses sound waves to create images of your heart. It provides your doctor with information about the size and shape of your heart and how well your heart's chambers and valves are working. This procedure takes approximately one hour. There are no restrictions for this procedure.  Follow-Up: Will be based on results of heart monitor and echocardiogram.  Other Instructions ZIO XT- Long Term Monitor Instructions  Your physician has requested you wear a ZIO patch monitor for 14 days.  This is a single patch monitor. [employer] supplies one patch monitor per enrollment. Additional stickers are not available. Please do not apply patch if you will be having a Nuclear Stress Test,  Echocardiogram, Cardiac CT, MRI, or Chest  Xray during the period you would be wearing the  monitor. The patch cannot be worn during these tests. You cannot remove and re-apply the  ZIO XT patch monitor.  Your ZIO patch monitor will be mailed 3 day USPS to your address on file. It may take 3-5 days  to receive your monitor after you have been enrolled.  Once you have received your monitor, please review the enclosed instructions. Your monitor  has already been registered assigning a specific monitor serial # to you.  Billing and Patient Assistance Program Information  We have supplied [employer] with any of your insurance information on file for billing purposes. [employer] offers a sliding scale Patient Assistance Program for patients that do not have  insurance, or whose insurance does not completely cover the cost of the ZIO monitor.  You must apply for the Patient Assistance Program to qualify for this discounted rate.  To apply, please call [employer] at [phone], select option 4, select option 2, ask to apply for  Patient Assistance Program. Theodore Demark will ask your household income, and how many people  are in your household. They will quote your out-of-pocket cost based on that information.  [employer] will also be able to set up a 51-month interest-free payment plan if needed.  Applying the monitor   Shave hair from upper left chest.  Hold abrader disc by orange tab. Rub abrader in 40 strokes over the upper left chest as  indicated in your monitor instructions.  Clean area with 4 enclosed alcohol pads. Let dry.  Apply patch as indicated in monitor instructions. Patch will be placed under collarbone on left  side of chest with arrow pointing upward.  Rub patch adhesive wings for 2 minutes. Remove white label marked "1". Remove the white  label marked "2". Rub patch adhesive wings for 2 additional minutes.  While looking in a mirror, press and release button in center of patch. A small green light will  flash 3-4 times. This will  be your only indicator that the monitor has been turned on.  Do not shower for the first 24 hours. You may shower after the first 24 hours.  Press the button if you feel a symptom. You will hear a small click. Record Date, Time and  Symptom in the Patient Logbook.  When you are ready to remove the patch, follow instructions on the last 2 pages of Patient  Logbook. Stick patch monitor onto the last page of Patient Logbook.  Place Patient Logbook in the blue and white box. Use locking tab on box and tape box closed  securely. The blue and white box has prepaid postage on it. Please place it in the mailbox as  soon as possible. Your physician should have your test results approximately 7 days after the  monitor has [facility]  Call [place]at [phone] you have questions regarding  your ZIO XT patch monitor. Call them immediately if you see an orange light blinking on your  monitor.  If your monitor falls off in less than 4 days, contact our Monitor department at [phone]  If your monitor becomes loose or falls off after 4 days call [employer] at [phone]  suggestions on securing your monitor   Important Information About Sugar         Signed, HSinclair Grooms MD  09/10/2021 11:37 AM    [facility]

## 2021-09-10 ENCOUNTER — Ambulatory Visit (INDEPENDENT_AMBULATORY_CARE_PROVIDER_SITE_OTHER): Payer: Medicare Other

## 2021-09-10 ENCOUNTER — Ambulatory Visit: Payer: Medicare Other | Attending: Interventional Cardiology | Admitting: Interventional Cardiology

## 2021-09-10 ENCOUNTER — Encounter: Payer: Self-pay | Admitting: Interventional Cardiology

## 2021-09-10 VITALS — BP 138/68 | HR 85 | Ht 62.0 in | Wt 148.6 lb

## 2021-09-10 DIAGNOSIS — N1831 Chronic kidney disease, stage 3a: Secondary | ICD-10-CM | POA: Diagnosis not present

## 2021-09-10 DIAGNOSIS — I493 Ventricular premature depolarization: Secondary | ICD-10-CM | POA: Diagnosis not present

## 2021-09-10 DIAGNOSIS — I447 Left bundle-branch block, unspecified: Secondary | ICD-10-CM | POA: Diagnosis not present

## 2021-09-10 DIAGNOSIS — E785 Hyperlipidemia, unspecified: Secondary | ICD-10-CM

## 2021-09-10 DIAGNOSIS — I10 Essential (primary) hypertension: Secondary | ICD-10-CM | POA: Diagnosis not present

## 2021-09-10 DIAGNOSIS — I2510 Atherosclerotic heart disease of native coronary artery without angina pectoris: Secondary | ICD-10-CM | POA: Diagnosis not present

## 2021-09-10 NOTE — Progress Notes (Unsigned)
Enrolled for [employer] to mail a ZIO XT long term holter monitor to the patients address on file.  

## 2021-09-10 NOTE — Patient Instructions (Signed)
Medication Instructions:  Your physician recommends that you continue on your current medications as directed. Please refer to the Current Medication list given to you today.  *If you need a refill on your cardiac medications before your next appointment, please call your pharmacy*  Lab Work: NONE  Testing/Procedures: Your physician has requested you wear a Zio heart monitor for 2 weeks. This will be mailed to your home with instructions on how to apply the monitor and how to return it when finished.  Your physician has requested that you have an echocardiogram. Echocardiography is a painless test that uses sound waves to create images of your heart. It provides your doctor with information about the size and shape of your heart and how well your heart's chambers and valves are working. This procedure takes approximately one hour. There are no restrictions for this procedure.  Follow-Up: Will be based on results of heart monitor and echocardiogram.  Other Instructions ZIO XT- Long Term Monitor Instructions  Your physician has requested you wear a ZIO patch monitor for 14 days.  This is a single patch monitor. [employer] supplies one patch monitor per enrollment. Additional stickers are not available. Please do not apply patch if you will be having a Nuclear Stress Test,  Echocardiogram, Cardiac CT, MRI, or Chest Xray during the period you would be wearing the  monitor. The patch cannot be worn during these tests. You cannot remove and re-apply the  ZIO XT patch monitor.  Your ZIO patch monitor will be mailed 3 day USPS to your address on file. It may take 3-5 days  to receive your monitor after you have been enrolled.  Once you have received your monitor, please review the enclosed instructions. Your monitor  has already been registered assigning a specific monitor serial # to you.  Billing and Patient Assistance Program Information  We have supplied [employer] with any of your insurance  information on file for billing purposes. [employer] offers a sliding scale Patient Assistance Program for patients that do not have  insurance, or whose insurance does not completely cover the cost of the ZIO monitor.  You must apply for the Patient Assistance Program to qualify for this discounted rate.  To apply, please call [employer] at [phone], select option 4, select option 2, ask to apply for  Patient Assistance Program. Theodore Demark will ask your household income, and how many people  are in your household. They will quote your out-of-pocket cost based on that information.  [employer] will also be able to set up a 57-month interest-free payment plan if needed.  Applying the monitor   Shave hair from upper left chest.  Hold abrader disc by orange tab. Rub abrader in 40 strokes over the upper left chest as  indicated in your monitor instructions.  Clean area with 4 enclosed alcohol pads. Let dry.  Apply patch as indicated in monitor instructions. Patch will be placed under collarbone on left  side of chest with arrow pointing upward.  Rub patch adhesive wings for 2 minutes. Remove white label marked "1". Remove the white  label marked "2". Rub patch adhesive wings for 2 additional minutes.  While looking in a mirror, press and release button in center of patch. A small green light will  flash 3-4 times. This will be your only indicator that the monitor has been turned on.  Do not shower for the first 24 hours. You may shower after the first 24 hours.  Press the button if you feel a  symptom. You will hear a small click. Record Date, Time and  Symptom in the Patient Logbook.  When you are ready to remove the patch, follow instructions on the last 2 pages of Patient  Logbook. Stick patch monitor onto the last page of Patient Logbook.  Place Patient Logbook in the blue and white box. Use locking tab on box and tape box closed  securely. The blue and white box has prepaid postage on it. Please  place it in the mailbox as  soon as possible. Your physician should have your test results approximately 7 days after the  monitor has been mailed back to [facility].  Call [facility] at [phone] if you have questions regarding  your ZIO XT patch monitor. Call them immediately if you see an orange light blinking on your  monitor.  If your monitor falls off in less than 4 days, contact our Monitor department at [phone].  If your monitor becomes loose or falls off after 4 days call [employer] at [phone] for  suggestions on securing your monitor   Important Information About [place]

## 2021-09-15 ENCOUNTER — Ambulatory Visit (HOSPITAL_COMMUNITY): Payer: Medicare Other | Attending: Cardiology

## 2021-09-15 DIAGNOSIS — I447 Left bundle-branch block, unspecified: Secondary | ICD-10-CM | POA: Diagnosis not present

## 2021-09-15 DIAGNOSIS — I493 Ventricular premature depolarization: Secondary | ICD-10-CM | POA: Insufficient documentation

## 2021-09-15 LAB — ECHOCARDIOGRAM COMPLETE
Area-P 1/2: 4.35 cm2
S' Lateral: 2.5 cm

## 2021-09-28 DIAGNOSIS — I493 Ventricular premature depolarization: Secondary | ICD-10-CM | POA: Diagnosis not present

## 2021-09-28 DIAGNOSIS — I447 Left bundle-branch block, unspecified: Secondary | ICD-10-CM | POA: Diagnosis not present

## 2021-10-19 DIAGNOSIS — I493 Ventricular premature depolarization: Secondary | ICD-10-CM | POA: Diagnosis not present

## 2021-10-19 DIAGNOSIS — I447 Left bundle-branch block, unspecified: Secondary | ICD-10-CM | POA: Diagnosis not present

## 2021-10-23 ENCOUNTER — Telehealth: Payer: Self-pay | Admitting: *Deleted

## 2021-10-23 MED ORDER — METOPROLOL SUCCINATE ER 25 MG PO TB24: 25.0000 mg | ORAL_TABLET | Freq: Two times a day (BID) | ORAL | 3 refills | Status: DC

## 2021-10-23 NOTE — Telephone Encounter (Signed)
-----   Message from Belva Crome, MD sent at 10/22/2021  1:53 PM EDT ----- Let the patient know the heart has spells of racing. Have her increase Metoprolol Succinate to 25 mg BID. Needs f/u with APP or me after 2-3 weeks. If the higher dose makes her feel worse, go back to 1 tablet daily. A copy will be sent to Stacie Glaze, DO

## 2021-11-09 NOTE — Progress Notes (Unsigned)
Cardiology Office Note:    Date:  11/10/2021   ID:  Tanya Berry, DOB 30-Jun-1935, MRN [id number]  PCP:  Denita Lung, MD   [facility] Providers Cardiologist:  None     Referring MD: Denita Lung, MD   Chief Complaint: paroxysmal SVT  History of Present Illness:    Tanya Berry is a very pleasant  86 y.o. female with a hx of LBBB, HTN, PVCs, CAD with BMS to LAD 2002, low normal LV function at time of coronary angiography, HLD, and CKD Stage 3a.   Was referred by PCP for evaluation of dizziness and seen on 09/10/21 by Dr. Tamala Julian. PCP was concerned about dizziness in the setting of possible arrhythmia that may be contributing. Cardiac monitor revealed NSR with BBB, 2 VT salvos with longest 7 beats, 25 SVT salvos, longest 17 beats at 135 bpm, PVC burden 11.3%, PAC burden 1.4%. She was advised to increase metoprolol succinate to 25 mg twice daily, but if this dose made her feel poorly, she could reduce back to 1 tablet daily. 2D echo revealed LVEF 60-65%, G1DD, mild pulmonary hypertensionm mild MR, moderate TR. Follow-up after cardiac monitor was recommended.  Today, she is here for follow-up. Feeling much better with additional dose of Toprol XL. No further episodes of dizziness. Stays active at home with housework, rides stationary bicycle for 10 minute intervals. No change in activity tolerance. No palpitations, chest discomfort, dyspnea, edema, orthopnea, presyncope or syncope. Dr Redmond School increased her atorvastatin to 40 mg daily in September due to LDL 95. She has not been taking it as directed because it makes her feel lightheaded. We discussed importance of reducing LDL and will change medications to see if she feels better on rosuvastatin. Lengthy discussion about SVT and management options.   Past Medical History:  Diagnosis Date   Allergy seasonal rhinitis   ASHD (arteriosclerotic heart disease) 2002   angioplasty   Diverticulosis    Dyslipidemia    GERD (gastroesophageal  reflux disease)    Hearing loss, sensorineural, high frequency    Hemorrhoid    Hypertension    Menopause    Osteopenia     Past Surgical History:  Procedure Laterality Date   COLONOSCOPY  2003    Current Medications: Current Meds  Medication Sig   amLODipine (NORVASC) 5 MG tablet Take 1 tablet (5 mg total) by mouth daily.   aspirin 81 MG EC tablet Take 81 mg by mouth daily.     Calcium Carb-Cholecalciferol 200-10 MG-MCG CAPS Take by mouth.   cholecalciferol (VITAMIN D) 1000 UNITS tablet Take 1,000 Units by mouth daily.    Cyanocobalamin (B-12 PO) Take by mouth.   Glucosamine HCl (GLUCOSAMINE PO) Take by mouth.   latanoprost (XALATAN) 0.005 % ophthalmic solution 1 drop at bedtime.   losartan-hydrochlorothiazide (HYZAAR) 50-12.5 MG tablet Take 1 tablet by mouth daily.   metoprolol succinate (TOPROL XL) 25 MG 24 hr tablet Take 1 tablet (25 mg total) by mouth in the morning and at bedtime.   Multiple Vitamins-Minerals (OSTEO COMPLEX PO) Take 1 tablet by mouth 1 dose over 46 hours.     omeprazole (PRILOSEC) 20 MG capsule TAKE 1 CAPSULE DAILY   rosuvastatin (CRESTOR) 20 MG tablet Take 1 tablet (20 mg total) by mouth daily.   [DISCONTINUED] atorvastatin (LIPITOR) 40 MG tablet Take 1 tablet (40 mg total) by mouth daily.     Allergies:   Patient has no known allergies.   Social History  Socioeconomic History   Marital status: Married    Spouse name: Not on file   Number of children: Not on file   Years of education: Not on file   Highest education level: Not on file  Occupational History   Not on file  Tobacco Use   Smoking status: Never   Smokeless tobacco: Never  Vaping Use   Vaping Use: Never used  Substance and Sexual Activity   Alcohol use: No   Drug use: No   Sexual activity: Not Currently  Other Topics Concern   Not on file  Social History Narrative   Not on file   Social Determinants of Health   Financial Resource Strain: Low Risk  (06/26/2021)   Overall  Financial Resource Strain (CARDIA)    Difficulty of Paying Living Expenses: Not hard at all  Food Insecurity: No Food Insecurity (06/26/2021)   Hunger Vital Sign    Worried About Running Out of Food in the Last Year: Never true    [place] in the Last Year: Never true  Transportation Needs: No Transportation Needs (06/26/2021)   PRAPARE - Hydrologist (Medical): No    Lack of Transportation (Non-Medical): No  Physical Activity: Insufficiently Active (06/26/2021)   Exercise Vital Sign    Days of Exercise per Week: 3 days    Minutes of Exercise per Session: 20 min  Stress: No Stress Concern Present (06/26/2021)   [place]    Feeling of Stress : Not at all  Social Connections: Not on file     Family History: The patient's family history is not on file.  ROS:   Please see the history of present illness.   All other systems reviewed and are negative.  Labs/Other Studies Reviewed:    The following studies were reviewed today:  Cardiac monitor 10/22/21    NSR with BBB   2 VT salvos, longest 7 beats at 116 bpm   25 SVT salvos, longest 17 beats at135 bpm   PAC burden 1.4%   PVC burden 11.3%   No symptoms reported.  Echo 09/15/21  1. Left ventricular ejection fraction, by estimation, is 60 to 65%. Left  ventricular ejection fraction by 3D volume is 62 %. The left ventricle has  normal function. The left ventricle has no regional wall motion  abnormalities. Left ventricular diastolic   parameters are consistent with Grade I diastolic dysfunction (impaired  relaxation). The average left ventricular global longitudinal strain is  -23.0 %. The global longitudinal strain is normal.   2. Right ventricular systolic function is normal. The right ventricular  size is normal. There is mildly elevated pulmonary artery systolic  pressure. The estimated right ventricular systolic pressure is  46.6 mmHg.   3. The mitral valve is normal in structure. Mild mitral valve  regurgitation. No evidence of mitral stenosis.   4. Tricuspid valve regurgitation is moderate.   5. The aortic valve is tricuspid. Aortic valve regurgitation is not  visualized. No aortic stenosis is present.   6. The inferior vena cava is normal in size with greater than 50%  respiratory variability, suggesting right atrial pressure of 3 mmHg.     Recent Labs: 09/08/2021: ALT 17; BUN 15; Creatinine, Ser 1.17; Hemoglobin 15.0; Platelets 256; Potassium 5.0; Sodium 139  Recent Lipid Panel    Component Value Date/Time   CHOL 184 09/08/2021 1538   TRIG 128 09/08/2021 1538   HDL  67 09/08/2021 1538   CHOLHDL 2.7 09/08/2021 1538   CHOLHDL 2.5 12/23/2015 0830   VLDL 17 12/23/2015 0830   LDLCALC 95 09/08/2021 1538     Risk Assessment/Calculations:      Physical Exam:    VS:  BP 130/72   Pulse 84   Ht '5\' 2"'$  (1.575 m)   Wt 133 lb 9.6 oz (60.6 kg)   SpO2 98%   BMI 24.44 kg/m     Wt Readings from Last 3 Encounters:  11/10/21 133 lb 9.6 oz (60.6 kg)  09/10/21 148 lb 9.6 oz (67.4 kg)  09/08/21 147 lb 3.2 oz (66.8 kg)     GEN:  Well nourished, well developed in no acute distress HEENT: Normal NECK: No JVD; No carotid bruits CARDIAC: RRR, no murmurs, rubs, gallops RESPIRATORY:  Clear to auscultation without rales, wheezing or rhonchi  ABDOMEN: Soft, non-tender, non-distended MUSCULOSKELETAL:  No edema; No deformity. 2+ pedal pulses, equal bilaterally SKIN: Warm and dry NEUROLOGIC:  Alert and oriented x 3 PSYCHIATRIC:  Normal affect   EKG:  EKG is not ordered today.     Diagnoses:    1. Paroxysmal SVT (supraventricular tachycardia)   2. Coronary artery disease involving native coronary artery of native heart without angina pectoris   3. Premature ventricular contractions   4. Hyperlipidemia with target LDL less than 70   5. Primary hypertension    Assessment and Plan:     PSVT: Cardiac  monitor ordered for dizziness. Monitor 10/2021 revealed 25 episodes of SVT, longest lasting 17 beats. PVC burden 11.3%. Normal LV function on echo 09/15/21. Was advised to increase Toprol XL to 25 mg twice daily. She reports she is feeling better since increasing Toprol. No further episodes of dizziness. No palpitations, chest pain, presyncope, or syncope. Lengthy discussion about management options. She would like to continue with beta-blocker therapy at this time. Advised her to notify us of worsening symptoms.   CAD without angina: Remote history of stent to LAD. She denies chest pain, dyspnea, or other symptoms concerning for angina.  No indication for further ischemic evaluation at this time. Remains active around her house, rides stationary bicycle for 10 minute intervals. Encouraged 150 minutes moderate intensity exercise each week. Discussed elevated LDL as noted below.   Hyperlipidemia LDL goal < 70: LDL 95 on 09/08/21. PCP increased atorvastatin but she is not taking it consistently because it makes her feel poorly. Will change her to rosuvastatin 20 mg daily and recheck fasting lipid/ALT in 2 months. Advised her to notify us if she has adverse effects to the medication.  Hypertension: BP is well-controlled today. She monitors periodically at home. No medication changes today.     Disposition: 6 months with APP  Medication Adjustments/Labs and Tests Ordered: Current medicines are reviewed at length with the patient today.  Concerns regarding medicines are outlined above.  Orders Placed This Encounter  Procedures   Lipid Profile   ALT   Meds ordered this encounter  Medications   rosuvastatin (CRESTOR) 20 MG tablet    Sig: Take 1 tablet (20 mg total) by mouth daily.    Dispense:  90 tablet    Refill:  3    Patient Instructions  Medication Instructions:   DISCONTINUE Atorvastatin  START Rosuvastatin one (1) tablet by mouth ( 20 mg) in the pm.   *If you need a refill on your  cardiac medications before your next appointment, please call your pharmacy*   Lab Work:  Your physician recommends  that you return for a FASTING lipid profile/ALT. Tuesday, January 9. You can come in on the day of your appointment anytime between 7:30-4:30 fasting from midnight the night before.   If you have labs (blood work) drawn today and your tests are completely normal, you will receive your results only by: [place] (if you have MyChart) OR A paper copy in the mail If you have any lab test that is abnormal or we need to change your treatment, we will call you to review the results.   Testing/Procedures:  None ordered.   Follow-Up: At [facility], you and your health needs are our priority.  As part of our continuing mission to provide you with exceptional heart care, we have created designated Provider Care Teams.  These Care Teams include your primary Cardiologist (physician) and Advanced Practice Providers (APPs -  Physician Assistants and Nurse Practitioners) who all work together to provide you with the care you need, when you need it.  We recommend signing up for the patient portal called "MyChart".  Sign up information is provided on this After Visit Summary.  MyChart is used to connect with patients for Virtual Visits (Telemedicine).  Patients are able to view lab/test results, encounter notes, upcoming appointments, etc.  Non-urgent messages can be sent to your provider as well.   To learn more about what you can do with MyChart, go to [email or web address].    Your next appointment:   6 month(s)  The format for your next appointment:   In Person  Provider:   Christen Bame, NP         Important Information About Sugar         Signed, Emmaline Life, NP  11/10/2021 11:18 AM    [place]

## 2021-11-10 ENCOUNTER — Ambulatory Visit: Payer: Medicare Other | Attending: Nurse Practitioner | Admitting: Nurse Practitioner

## 2021-11-10 ENCOUNTER — Encounter: Payer: Self-pay | Admitting: Nurse Practitioner

## 2021-11-10 VITALS — BP 130/72 | HR 84 | Ht 62.0 in | Wt 133.6 lb

## 2021-11-10 DIAGNOSIS — I471 Supraventricular tachycardia, unspecified: Secondary | ICD-10-CM

## 2021-11-10 DIAGNOSIS — I10 Essential (primary) hypertension: Secondary | ICD-10-CM

## 2021-11-10 DIAGNOSIS — I2510 Atherosclerotic heart disease of native coronary artery without angina pectoris: Secondary | ICD-10-CM | POA: Diagnosis not present

## 2021-11-10 DIAGNOSIS — E785 Hyperlipidemia, unspecified: Secondary | ICD-10-CM

## 2021-11-10 DIAGNOSIS — I493 Ventricular premature depolarization: Secondary | ICD-10-CM | POA: Diagnosis not present

## 2021-11-10 MED ORDER — ROSUVASTATIN CALCIUM 20 MG PO TABS: 20.0000 mg | ORAL_TABLET | Freq: Every day | ORAL | 3 refills | Status: DC

## 2021-11-10 NOTE — Patient Instructions (Signed)
Medication Instructions:   DISCONTINUE Atorvastatin  START Rosuvastatin one (1) tablet by mouth ( 20 mg) in the pm.   *If you need a refill on your cardiac medications before your next appointment, please call your pharmacy*   Lab Work:  Your physician recommends that you return for a FASTING lipid profile/ALT. Tuesday, January 9. You can come in on the day of your appointment anytime between 7:30-4:30 fasting from midnight the night before.   If you have labs (blood work) drawn today and your tests are completely normal, you will receive your results only by: [place] (if you have MyChart) OR A paper copy in the mail If you have any lab test that is abnormal or we need to change your treatment, we will call you to review the results.   Testing/Procedures:  None ordered.   Follow-Up: At [facility], you and your health needs are our priority.  As part of our continuing mission to provide you with exceptional heart care, we have created designated Provider Care Teams.  These Care Teams include your primary Cardiologist (physician) and Advanced Practice Providers (APPs -  Physician Assistants and Nurse Practitioners) who all work together to provide you with the care you need, when you need it.  We recommend signing up for the patient portal called "MyChart".  Sign up information is provided on this After Visit Summary.  MyChart is used to connect with patients for Virtual Visits (Telemedicine).  Patients are able to view lab/test results, encounter notes, upcoming appointments, etc.  Non-urgent messages can be sent to your provider as well.   To learn more about what you can do with MyChart, go to [email or web address].    Your next appointment:   6 month(s)  The format for your next appointment:   In Person  Provider:   Christen Bame, NP         Important Information About Sugar

## 2021-11-16 DIAGNOSIS — H6123 Impacted cerumen, bilateral: Secondary | ICD-10-CM | POA: Diagnosis not present

## 2021-12-18 DIAGNOSIS — Z23 Encounter for immunization: Secondary | ICD-10-CM | POA: Diagnosis not present

## 2022-01-12 ENCOUNTER — Ambulatory Visit: Payer: Medicare Other | Attending: Nurse Practitioner

## 2022-01-12 DIAGNOSIS — E785 Hyperlipidemia, unspecified: Secondary | ICD-10-CM | POA: Diagnosis not present

## 2022-01-12 DIAGNOSIS — I10 Essential (primary) hypertension: Secondary | ICD-10-CM | POA: Diagnosis not present

## 2022-01-12 DIAGNOSIS — I493 Ventricular premature depolarization: Secondary | ICD-10-CM

## 2022-01-12 LAB — LIPID PANEL
Chol/HDL Ratio: 2.3 ratio (ref 0.0–4.4)
Cholesterol, Total: 151 mg/dL (ref 100–199)
HDL: 67 mg/dL (ref >39)
LDL Chol Calc (NIH): 64 mg/dL (ref 0–99)
Triglycerides: 112 mg/dL (ref 0–149)
VLDL Cholesterol Cal: 20 mg/dL (ref 5–40)

## 2022-01-12 LAB — ALT: ALT: 18 IU/L (ref 0–32)

## 2022-01-15 NOTE — Progress Notes (Signed)
Pt has been made aware of normal result and verbalized understanding.  jw

## 2022-02-01 ENCOUNTER — Other Ambulatory Visit: Payer: Self-pay | Admitting: Family Medicine

## 2022-02-01 DIAGNOSIS — K219 Gastro-esophageal reflux disease without esophagitis: Secondary | ICD-10-CM

## 2022-03-04 ENCOUNTER — Telehealth: Payer: Self-pay | Admitting: Family Medicine

## 2022-03-04 NOTE — Telephone Encounter (Signed)
Pt was called concerning Prolia. Pt advised insurance estimated cost was $0 but deductible was not been met. Pt scheduled an appt for 03/11/2022 and advised to call when leaving so medication can be set out. Pt verbalized understanding. Medication ordered from Physician services.

## 2022-03-11 ENCOUNTER — Other Ambulatory Visit (INDEPENDENT_AMBULATORY_CARE_PROVIDER_SITE_OTHER): Payer: Medicare Other

## 2022-03-11 DIAGNOSIS — M810 Age-related osteoporosis without current pathological fracture: Secondary | ICD-10-CM | POA: Diagnosis not present

## 2022-03-11 MED ORDER — DENOSUMAB 60 MG/ML ~~LOC~~ SOSY
60.0000 mg | PREFILLED_SYRINGE | Freq: Once | SUBCUTANEOUS | Status: AC
  Administered 2022-03-11: 60 mg via SUBCUTANEOUS

## 2022-04-17 NOTE — Progress Notes (Unsigned)
Cardiology Office Note:    Date:  04/20/2022   ID:  Tanya Berry, DOB Mar 07, 1935, MRN [id number]  PCP:  [name], MD   [facility] Providers Cardiologist:  Lance Muss, MD     Referring MD: [name], MD   Chief Complaint: paroxysmal SVT  History of Present Illness:    Tanya Berry is a very pleasant  87 y.o. female with a hx of LBBB, HTN, PVCs, CAD with BMS to LAD 2002, low normal LV function at time of coronary angiography, HLD, and CKD Stage 3a.   Was referred by PCP for evaluation of dizziness and seen on 09/10/21 by Dr. Katrinka Blazing. PCP was concerned about dizziness in the setting of possible arrhythmia that may be contributing. Cardiac monitor revealed NSR with BBB, 2 VT salvos with longest 7 beats, 25 SVT salvos, longest 17 beats at 135 bpm, PVC burden 11.3%, PAC burden 1.4%. She was advised to increase metoprolol succinate to 25 mg twice daily, but if this dose made her feel poorly, she could reduce back to 1 tablet daily. 2D echo revealed LVEF 60-65%, G1DD, mild pulmonary hypertensionm mild MR, moderate TR. Follow-up after cardiac monitor was recommended.  Seen in clinic by me on 11/10/21 for follow-up. Feeling much better with additional dose of Toprol XL. No further episodes of dizziness. Stays active at home with housework, rides stationary bicycle for 10 minute intervals. No change in activity tolerance. No palpitations, chest discomfort, dyspnea, edema, orthopnea, presyncope or syncope. Dr Susann Givens increased her atorvastatin to 40 mg daily in September due to LDL 95. She has not been taking it as directed because it makes her feel lightheaded. We discussed importance of reducing LDL and will change medications to see if she feels better on rosuvastatin. Lengthy discussion about SVT and management options. Beta blocker therapy was continued for management of SVT and she was switched from atorvastatin to rosuvastatin with plan for fasting lab work in 2 months.  LDL  improved from 95-64 on 01/12/2022 with stable ALT.  She was advised to continue therapy.  Today, she is here for follow-up. Reports she is feeling well. Continues to have episodic dizziness. Says her husband feels that it is age-related. Fell on one occasion after waking up in her chair and tripping over a rug when going into the dark bathroom, struck her teeth on the bathtub.  No other episodes of presyncope, syncope.  She denies chest pain.  Not having any episodes of palpitations or heart racing.  She does not exercise on a regular basis but remains active doing housework.  No shortness of breath, dyspnea, orthopnea, PND, or edema.   Past Medical History:  Diagnosis Date   Allergy seasonal rhinitis   ASHD (arteriosclerotic heart disease) 2002   angioplasty   Diverticulosis    Dyslipidemia    GERD (gastroesophageal reflux disease)    Hearing loss, sensorineural, high frequency    Hemorrhoid    Hypertension    Menopause    Osteopenia     Past Surgical History:  Procedure Laterality Date   COLONOSCOPY  2003    Current Medications: Current Meds  Medication Sig   amLODipine (NORVASC) 5 MG tablet Take 1 tablet (5 mg total) by mouth daily.   aspirin 81 MG EC tablet Take 81 mg by mouth daily.     Calcium Carb-Cholecalciferol 200-10 MG-MCG CAPS Take by mouth.   cholecalciferol (VITAMIN D) 1000 UNITS tablet Take 1,000 Units by mouth daily.  Cyanocobalamin (B-12 PO) Take by mouth.   Glucosamine HCl (GLUCOSAMINE PO) Take by mouth.   latanoprost (XALATAN) 0.005 % ophthalmic solution 1 drop at bedtime.   losartan-hydrochlorothiazide (HYZAAR) 50-12.5 MG tablet Take 1 tablet by mouth daily.   metoprolol succinate (TOPROL XL) 25 MG 24 hr tablet Take 1 tablet (25 mg total) by mouth in the morning and at bedtime.   Multiple Vitamins-Minerals (OSTEO COMPLEX PO) Take 1 tablet by mouth 1 dose over 46 hours.     omeprazole (PRILOSEC) 20 MG capsule TAKE 1 CAPSULE DAILY   rosuvastatin (CRESTOR) 20  MG tablet Take 1 tablet (20 mg total) by mouth daily.     Allergies:   Patient has no known allergies.   Social History   Socioeconomic History   Marital status: Married    Spouse name: Not on file   Number of children: Not on file   Years of education: Not on file   Highest education level: Not on file  Occupational History   Not on file  Tobacco Use   Smoking status: Never   Smokeless tobacco: Never  Vaping Use   Vaping Use: Never used  Substance and Sexual Activity   Alcohol use: No   Drug use: No   Sexual activity: Not Currently  Other Topics Concern   Not on file  Social History Narrative   Not on file   Social Determinants of Health   Financial Resource Strain: Low Risk  (06/26/2021)   Overall Financial Resource Strain (CARDIA)    Difficulty of Paying Living Expenses: Not hard at all  Food Insecurity: No Food Insecurity (06/26/2021)   Hunger Vital Sign    Worried About Running Out of Food in the Last Year: Never true    Ran Out of Food in the Last Year: Never true  Transportation Needs: No Transportation Needs (06/26/2021)   PRAPARE - Administrator, Civil Service (Medical): No    Lack of Transportation (Non-Medical): No  Physical Activity: Insufficiently Active (06/26/2021)   Exercise Vital Sign    Days of Exercise per Week: 3 days    Minutes of Exercise per Session: 20 min  Stress: No Stress Concern Present (06/26/2021)   [employer] of Occupational Health - Occupational Stress Questionnaire    Feeling of Stress : Not at all  Social Connections: Not on file     Family History: The patient's family history is not on file.  ROS:   Please see the history of present illness.   All other systems reviewed and are negative.  Labs/Other Studies Reviewed:    The following studies were reviewed today:  Cardiac monitor 10/22/21    NSR with BBB   2 VT salvos, longest 7 beats at 116 bpm   25 SVT salvos, longest 17 beats at135 bpm   PAC burden  1.4%   PVC burden 11.3%   No symptoms reported.  Echo 09/15/21  1. Left ventricular ejection fraction, by estimation, is 60 to 65%. Left  ventricular ejection fraction by 3D volume is 62 %. The left ventricle has  normal function. The left ventricle has no regional wall motion  abnormalities. Left ventricular diastolic   parameters are consistent with Grade I diastolic dysfunction (impaired  relaxation). The average left ventricular global longitudinal strain is  -23.0 %. The global longitudinal strain is normal.   2. Right ventricular systolic function is normal. The right ventricular  size is normal. There is mildly elevated pulmonary artery systolic  pressure.  The estimated right ventricular systolic pressure is 42.4 mmHg.   3. The mitral valve is normal in structure. Mild mitral valve  regurgitation. No evidence of mitral stenosis.   4. Tricuspid valve regurgitation is moderate.   5. The aortic valve is tricuspid. Aortic valve regurgitation is not  visualized. No aortic stenosis is present.   6. The inferior vena cava is normal in size with greater than 50%  respiratory variability, suggesting right atrial pressure of 3 mmHg.     Recent Labs: 09/08/2021: BUN 15; Creatinine, Ser 1.17; Hemoglobin 15.0; Platelets 256; Potassium 5.0; Sodium 139 01/12/2022: ALT 18  Recent Lipid Panel    Component Value Date/Time   CHOL 151 01/12/2022 1105   TRIG 112 01/12/2022 1105   HDL 67 01/12/2022 1105   CHOLHDL 2.3 01/12/2022 1105   CHOLHDL 2.5 12/23/2015 0830   VLDL 17 12/23/2015 0830   LDLCALC 64 01/12/2022 1105     Risk Assessment/Calculations:      Physical Exam:    VS:  BP 132/70   Pulse 76   Ht 5\' 4"  (1.626 m)   Wt 149 lb 9.6 oz (67.9 kg)   SpO2 96%   BMI 25.68 kg/m     Wt Readings from Last 3 Encounters:  04/20/22 149 lb 9.6 oz (67.9 kg)  11/10/21 133 lb 9.6 oz (60.6 kg)  09/10/21 148 lb 9.6 oz (67.4 kg)     GEN:  Well nourished, well developed in no acute  distress HEENT: Normal NECK: No JVD; No carotid bruits CARDIAC: RRR, no murmurs, rubs, gallops RESPIRATORY:  Clear to auscultation without rales, wheezing or rhonchi  ABDOMEN: Soft, non-tender, non-distended MUSCULOSKELETAL:  No edema; No deformity. 2+ pedal pulses, equal bilaterally SKIN: Warm and dry NEUROLOGIC:  Alert and oriented x 3 PSYCHIATRIC:  Normal affect   EKG:  EKG is not ordered today   Diagnoses:    1. Paroxysmal SVT (supraventricular tachycardia)   2. Primary hypertension   3. Hyperlipidemia with target LDL less than 70   4. Coronary artery disease involving native coronary artery of native heart without angina pectoris   5. LBBB (left bundle branch block)     Assessment and Plan:     PSVT: Cardiac monitor ordered for dizziness. Monitor 10/2021 revealed 25 episodes of SVT, longest lasting 17 beats. PVC burden 11.3%. Normal LV function on echo 09/15/21. Was advised to increase Toprol XL to 25 mg twice daily. We discussed Toprol dosing, she thinks she feels best on Toprol XL 25 mg once daily. Advised she may take additional metoprolol as needed for palpitations, tachycardia. No palpitations, chest pain, presyncope, or syncope.   Left BBB: Known. Normal LV function on echo 09/15/2021. HR is stable. No indication for further testing at this time.   CAD without angina: Remote history of stent to LAD. She denies chest pain, dyspnea, or other symptoms concerning for angina.  No indication for further ischemic evaluation at this time. Remains active around her house.  Secondary prevention recommended. Encouraged her to walk for 20-30 minutes daily for total of 150 minutes moderate intensity exercise each week. LDL has improved on rosuvastatin. Encouraged heart healthy, mostly plant-based diet.  Hyperlipidemia LDL goal < 70: LDL 64 on 01/12/22. She is feeling well on this therapy. Continue rosuvastatin.   Hypertension: BP is well-controlled.      Disposition: 1 year with Dr.  Eldridge Dace or me (transfer from Dr. Katrinka Blazing)  Medication Adjustments/Labs and Tests Ordered: Current medicines are reviewed at length with the  patient today.  Concerns regarding medicines are outlined above.  No orders of the defined types were placed in this encounter.  No orders of the defined types were placed in this encounter.   Patient Instructions  Medication Instructions:   Your physician recommends that you continue on your current medications as directed. Please refer to the Current Medication list given to you today.   *If you need a refill on your cardiac medications before your next appointment, please call your pharmacy*   Lab Work:  None ordered.  If you have labs (blood work) drawn today and your tests are completely normal, you will receive your results only by: MyChart Message (if you have MyChart) OR A paper copy in the mail If you have any lab test that is abnormal or we need to change your treatment, we will call you to review the results.   Testing/Procedures:  None ordered.   Follow-Up: At [facility], you and your health needs are our priority.  As part of our continuing mission to provide you with exceptional heart care, we have created designated Provider Care Teams.  These Care Teams include your primary Cardiologist (physician) and Advanced Practice Providers (APPs -  Physician Assistants and Nurse Practitioners) who all work together to provide you with the care you need, when you need it.  We recommend signing up for the patient portal called "MyChart".  Sign up information is provided on this After Visit Summary.  MyChart is used to connect with patients for Virtual Visits (Telemedicine).  Patients are able to view lab/test results, encounter notes, upcoming appointments, etc.  Non-urgent messages can be sent to your provider as well.   To learn more about what you can do with MyChart, go to [email or web address].    Your next appointment:    1 year(s)  Provider:   Eligha Bridegroom, NP         Other Instructions  Your physician wants you to follow-up in: 1 year with Lebron Conners You will receive a reminder letter in the mail two months in advance. If you don't receive a letter, please call our office to schedule the follow-up appointment.  Adopting a Healthy Lifestyle.   Weight: Know what a healthy weight is for you (roughly BMI <25) and aim to maintain this. You can calculate your body mass index on your smart phone  Diet: Aim for 7+ servings of fruits and vegetables daily Limit animal fats in diet for cholesterol and heart health - choose grass fed whenever available Avoid highly processed foods (fast food burgers, tacos, fried chicken, pizza, hot dogs, french fries)  Saturated fat comes in the form of butter, lard, coconut oil, margarine, partially hydrogenated oils, and fat in meat. These increase your risk of cardiovascular disease.  Use healthy plant oils, such as olive, canola, soy, corn, sunflower and peanut.  Whole foods such as fruits, vegetables and whole grains have fiber  Men need > 38 grams of fiber per day Women need > 25 grams of fiber per day  Load up on vegetables and fruits - one-half of your plate: Aim for color and variety, and remember that potatoes dont count. Go for whole grains - one-quarter of your plate: Whole wheat, barley, wheat berries, quinoa, oats, brown rice, and foods made with them. If you want pasta, go with whole wheat pasta. Protein power - one-quarter of your plate: Fish, chicken, beans, and nuts are all healthy, versatile protein sources. Limit red meat. You need carbohydrates for  energy! The type of carbohydrate is more important than the amount. Choose carbohydrates such as vegetables, fruits, whole grains, beans, and nuts in the place of white rice, white pasta, potatoes (baked or fried), macaroni and cheese, cakes, cookies, and donuts.  If youre thirsty, drink water. Coffee  and tea are good in moderation, but skip sugary drinks and limit milk and dairy products to one or two daily servings. Keep sugar intake at 6 teaspoons or 24 grams or LESS       Exercise: Aim for 150 min of moderate intensity exercise weekly for heart health, and weights twice weekly for bone health Stay active - any steps are better than no steps! Aim for 7-9 hours of sleep daily           Signed, Levi Aland, NP  04/20/2022 1:06 PM    [facility]

## 2022-04-20 ENCOUNTER — Encounter: Payer: Self-pay | Admitting: Nurse Practitioner

## 2022-04-20 ENCOUNTER — Ambulatory Visit: Payer: Medicare Other | Attending: Nurse Practitioner | Admitting: Nurse Practitioner

## 2022-04-20 VITALS — BP 132/70 | HR 76 | Ht 64.0 in | Wt 149.6 lb

## 2022-04-20 DIAGNOSIS — I447 Left bundle-branch block, unspecified: Secondary | ICD-10-CM

## 2022-04-20 DIAGNOSIS — E785 Hyperlipidemia, unspecified: Secondary | ICD-10-CM | POA: Diagnosis not present

## 2022-04-20 DIAGNOSIS — I10 Essential (primary) hypertension: Secondary | ICD-10-CM | POA: Diagnosis not present

## 2022-04-20 DIAGNOSIS — I2510 Atherosclerotic heart disease of native coronary artery without angina pectoris: Secondary | ICD-10-CM | POA: Diagnosis not present

## 2022-04-20 DIAGNOSIS — I471 Supraventricular tachycardia, unspecified: Secondary | ICD-10-CM | POA: Diagnosis not present

## 2022-04-20 NOTE — Patient Instructions (Signed)
Medication Instructions:   Your physician recommends that you continue on your current medications as directed. Please refer to the Current Medication list given to you today.   *If you need a refill on your cardiac medications before your next appointment, please call your pharmacy*   Lab Work:  None ordered.  If you have labs (blood work) drawn today and your tests are completely normal, you will receive your results only by: MyChart Message (if you have MyChart) OR A paper copy in the mail If you have any lab test that is abnormal or we need to change your treatment, we will call you to review the results.   Testing/Procedures:  None ordered.   Follow-Up: At [facility], you and your health needs are our priority.  As part of our continuing mission to provide you with exceptional heart care, we have created designated Provider Care Teams.  These Care Teams include your primary Cardiologist (physician) and Advanced Practice Providers (APPs -  Physician Assistants and Nurse Practitioners) who all work together to provide you with the care you need, when you need it.  We recommend signing up for the patient portal called "MyChart".  Sign up information is provided on this After Visit Summary.  MyChart is used to connect with patients for Virtual Visits (Telemedicine).  Patients are able to view lab/test results, encounter notes, upcoming appointments, etc.  Non-urgent messages can be sent to your provider as well.   To learn more about what you can do with MyChart, go to [email or web address].    Your next appointment:   1 year(s)  Provider:   Eligha Bridegroom, NP         Other Instructions  Your physician wants you to follow-up in: 1 year with Lebron Conners You will receive a reminder letter in the mail two months in advance. If you don't receive a letter, please call our office to schedule the follow-up appointment.  Adopting a Healthy Lifestyle.   Weight:  Know what a healthy weight is for you (roughly BMI <25) and aim to maintain this. You can calculate your body mass index on your smart phone  Diet: Aim for 7+ servings of fruits and vegetables daily Limit animal fats in diet for cholesterol and heart health - choose grass fed whenever available Avoid highly processed foods (fast food burgers, tacos, fried chicken, pizza, hot dogs, french fries)  Saturated fat comes in the form of butter, lard, coconut oil, margarine, partially hydrogenated oils, and fat in meat. These increase your risk of cardiovascular disease.  Use healthy plant oils, such as olive, canola, soy, corn, sunflower and peanut.  Whole foods such as fruits, vegetables and whole grains have fiber  Men need > 38 grams of fiber per day Women need > 25 grams of fiber per day  Load up on vegetables and fruits - one-half of your plate: Aim for color and variety, and remember that potatoes dont count. Go for whole grains - one-quarter of your plate: Whole wheat, barley, wheat berries, quinoa, oats, brown rice, and foods made with them. If you want pasta, go with whole wheat pasta. Protein power - one-quarter of your plate: Fish, chicken, beans, and nuts are all healthy, versatile protein sources. Limit red meat. You need carbohydrates for energy! The type of carbohydrate is more important than the amount. Choose carbohydrates such as vegetables, fruits, whole grains, beans, and nuts in the place of white rice, white pasta, potatoes (baked or fried), macaroni and cheese, cakes,  cookies, and donuts.  If youre thirsty, drink water. Coffee and tea are good in moderation, but skip sugary drinks and limit milk and dairy products to one or two daily servings. Keep sugar intake at 6 teaspoons or 24 grams or LESS       Exercise: Aim for 150 min of moderate intensity exercise weekly for heart health, and weights twice weekly for bone health Stay active - any steps are better than no steps! Aim  for 7-9 hours of sleep daily

## 2022-06-18 DIAGNOSIS — Z961 Presence of intraocular lens: Secondary | ICD-10-CM | POA: Diagnosis not present

## 2022-06-18 DIAGNOSIS — H35372 Puckering of macula, left eye: Secondary | ICD-10-CM | POA: Diagnosis not present

## 2022-06-18 DIAGNOSIS — H401131 Primary open-angle glaucoma, bilateral, mild stage: Secondary | ICD-10-CM | POA: Diagnosis not present

## 2022-06-18 DIAGNOSIS — H04123 Dry eye syndrome of bilateral lacrimal glands: Secondary | ICD-10-CM | POA: Diagnosis not present

## 2022-08-23 ENCOUNTER — Telehealth: Payer: Self-pay | Admitting: Internal Medicine

## 2022-08-23 NOTE — Telephone Encounter (Signed)
Prolia estimated cost is $0. Pt has met deductible. Pt is scheduled for 09/14/22. I will order through physicians services

## 2022-08-24 ENCOUNTER — Ambulatory Visit (INDEPENDENT_AMBULATORY_CARE_PROVIDER_SITE_OTHER): Payer: Medicare Other

## 2022-08-24 DIAGNOSIS — Z0000 Encounter for general adult medical examination without abnormal findings: Secondary | ICD-10-CM | POA: Diagnosis not present

## 2022-08-24 NOTE — Progress Notes (Signed)
Subjective:   Tanya Berry is a 87 y.o. female who presents for Medicare Annual (Subsequent) preventive examination.  Visit Complete: Virtual  I connected with  Tanya Berry on 08/24/22 by a audio enabled telemedicine application and verified that I am speaking with the correct person using two identifiers.  Patient Location: Home  Provider Location: Office/Clinic  I discussed the limitations of evaluation and management by telemedicine. The patient expressed understanding and agreed to proceed.  Vital Signs: Unable to obtain new vitals due to this being a telehealth visit.  Review of Systems     Cardiac Risk Factors include: advanced age (>59men, >40 women);dyslipidemia;hypertension     Objective:    Today's Vitals   There is no height or weight on file to calculate BMI.     08/24/2022    8:49 AM 06/26/2021    8:15 AM 06/12/2020    9:37 AM 06/12/2019    9:53 AM 06/05/2018   11:31 AM 05/10/2017   10:55 AM 12/23/2015    9:15 AM  Advanced Directives  Does Patient Have a Medical Advance Directive? Yes Yes Yes Yes Yes Yes Yes  Type of Estate agent of [place];Living will Healthcare Power of [place];Living will  Healthcare Power of [place];Living will Healthcare Power of [place];Living will Healthcare Power of [place];Living will   Does patient want to make changes to medical advance directive?   No - Patient declined No - Patient declined No - Patient declined No - Patient declined No - Patient declined  Copy of Healthcare Power of Attorney in Chart? Yes - validated most recent copy scanned in chart (See row information) Yes - validated most recent copy scanned in chart (See row information)  No - copy requested Yes - validated most recent copy scanned in chart (See row information) Yes     Current Medications (verified) Outpatient Encounter Medications as of 08/24/2022  Medication Sig   amLODipine (NORVASC) 5 MG tablet Take 1 tablet (5 mg total) by mouth  daily.   aspirin 81 MG EC tablet Take 81 mg by mouth daily.     Calcium Carb-Cholecalciferol 200-10 MG-MCG CAPS Take by mouth.   cholecalciferol (VITAMIN D) 1000 UNITS tablet Take 1,000 Units by mouth daily.    Cyanocobalamin (B-12 PO) Take by mouth.   Glucosamine HCl (GLUCOSAMINE PO) Take by mouth.   latanoprost (XALATAN) 0.005 % ophthalmic solution 1 drop at bedtime.   losartan-hydrochlorothiazide (HYZAAR) 50-12.5 MG tablet Take 1 tablet by mouth daily.   metoprolol succinate (TOPROL XL) 25 MG 24 hr tablet Take 1 tablet (25 mg total) by mouth in the morning and at bedtime.   Multiple Vitamins-Minerals (OSTEO COMPLEX PO) Take 1 tablet by mouth 1 dose over 46 hours.     omeprazole (PRILOSEC) 20 MG capsule TAKE 1 CAPSULE DAILY   rosuvastatin (CRESTOR) 20 MG tablet Take 1 tablet (20 mg total) by mouth daily.   No facility-administered encounter medications on file as of 08/24/2022.    Allergies (verified) Patient has no known allergies.   History: Past Medical History:  Diagnosis Date   Allergy seasonal rhinitis   ASHD (arteriosclerotic heart disease) 2002   angioplasty   Diverticulosis    Dyslipidemia    GERD (gastroesophageal reflux disease)    Hearing loss, sensorineural, high frequency    Hemorrhoid    Hypertension    Menopause    Osteopenia    Past Surgical History:  Procedure Laterality Date   COLONOSCOPY  2003   History  reviewed. No pertinent family history. Social History   Socioeconomic History   Marital status: Married    Spouse name: Not on file   Number of children: Not on file   Years of education: Not on file   Highest education level: Not on file  Occupational History   Not on file  Tobacco Use   Smoking status: Never   Smokeless tobacco: Never  Vaping Use   Vaping status: Never Used  Substance and Sexual Activity   Alcohol use: No   Drug use: No   Sexual activity: Not Currently  Other Topics Concern   Not on file  Social History Narrative    Not on file   Social Determinants of Health   Financial Resource Strain: Low Risk  (08/24/2022)   Overall Financial Resource Strain (CARDIA)    Difficulty of Paying Living Expenses: Not hard at all  Food Insecurity: No Food Insecurity (08/24/2022)   Hunger Vital Sign    Worried About Running Out of Food in the Last Year: Never true    Ran Out of Food in the Last Year: Never true  Transportation Needs: No Transportation Needs (08/24/2022)   PRAPARE - Administrator, Civil Service (Medical): No    Lack of Transportation (Non-Medical): No  Physical Activity: Insufficiently Active (08/24/2022)   Exercise Vital Sign    Days of Exercise per Week: 7 days    Minutes of Exercise per Session: 10 min  Stress: No Stress Concern Present (08/24/2022)   [employer] of Occupational Health - Occupational Stress Questionnaire    Feeling of Stress : Not at all  Social Connections: Socially Integrated (08/24/2022)   Social Connection and Isolation Panel [NHANES]    Frequency of Communication with Friends and Family: Three times a week    Frequency of Social Gatherings with Friends and Family: Once a week    Attends Religious Services: More than 4 times per year    Active Member of [employer] or Organizations: Yes    Attends Engineer, structural: More than 4 times per year    Marital Status: Married    Tobacco Counseling Counseling given: Not Answered   Clinical Intake:  Pre-visit preparation completed: Yes  Pain : No/denies pain     Nutritional Risks: None Diabetes: No  How often do you need to have someone help you when you read instructions, pamphlets, or other written materials from your doctor or pharmacy?: 1 - Never  Interpreter Needed?: No  Information entered by :: NAllen LPN   Activities of Daily Living    08/24/2022    8:42 AM  In your present state of health, do you have any difficulty performing the following activities:  Hearing? 1  Comment has  hearing aids  Vision? 1  Comment has film over left eye, working with eye doctor  Difficulty concentrating or making decisions? 0  Walking or climbing stairs? 0  Dressing or bathing? 0  Doing errands, shopping? 0  Preparing Food and eating ? N  Using the Toilet? N  In the past six months, have you accidently leaked urine? Y  Comment wears a pad  Do you have problems with loss of bowel control? N  Managing your Medications? N  Managing your Finances? N  Housekeeping or managing your Housekeeping? N    Patient Care Team: [name], MD as PCP - General Corky Crafts, MD as PCP - Cardiology (Cardiology) Augustin Schooling, MD as Consulting Physician  Indicate  any recent Medical Services you may have received from other than [facility] providers in the past year (date may be approximate).     Assessment:   This is a routine wellness examination for Tanya Berry.  Hearing/Vision screen Hearing Screening - Comments:: Has hearing aids that maintained Vision Screening - Comments:: Regular eye exams, [facility]  Dietary issues and exercise activities discussed:     Goals Addressed             This Visit's Progress    Patient Stated       08/24/2022, stay flexible       Depression Screen    08/24/2022    8:54 AM 06/26/2021    8:17 AM 06/12/2020    9:38 AM 06/12/2019    9:37 AM 06/05/2018   11:07 AM 05/10/2017   10:21 AM 12/23/2015    8:46 AM  PHQ 2/9 Scores  PHQ - 2 Score 0 0 0 0 0 0 0  PHQ- 9 Score 0          Fall Risk    08/24/2022    8:53 AM 06/26/2021    8:17 AM 06/12/2020    9:38 AM 06/12/2019    9:37 AM 06/05/2018   11:07 AM  Fall Risk   Falls in the past year? 0 0 0 0 0  Number falls in past yr: 0 0 0    Injury with Fall? 0 0 0    Risk for fall due to : Medication side effect Medication side effect No Fall Risks    Follow up Falls prevention discussed;Falls evaluation completed Falls evaluation completed;Education provided;Falls prevention discussed Falls evaluation  completed      MEDICARE RISK AT HOME: Medicare Risk at Home Any stairs in or around the home?: Yes (has a ramp) If so, are there any without handrails?: No Home free of loose throw rugs in walkways, pet beds, electrical cords, etc?: Yes Adequate lighting in your home to reduce risk of falls?: Yes Life alert?: No Use of a cane, walker or w/c?: No Grab bars in the bathroom?: Yes Shower chair or bench in shower?: No Elevated toilet seat or a handicapped toilet?: Yes  TIMED UP AND GO:  Was the test performed?  No    Cognitive Function:        08/24/2022    8:55 AM 06/26/2021    8:19 AM  6CIT Screen  What Year? 0 points 0 points  What month? 0 points 0 points  What time? 0 points 0 points  Count back from 20 0 points 2 points  Months in reverse 0 points 0 points  Repeat phrase 0 points 0 points  Total Score 0 points 2 points    Immunizations Immunization History  Administered Date(s) Administered   DTaP 03/22/2008   Fluad Quad(high Dose 65+) 10/02/2018, 10/23/2019, 10/28/2020   Influenza Split 10/27/2010, 10/27/2011   Influenza Whole 09/23/2009   Influenza, High Dose Seasonal PF 10/01/2013, 12/17/2014, 11/11/2015, 10/05/2016, 01/25/2018   Influenza,inj,Quad PF,6+ Mos 09/26/2012, 12/18/2021   PFIZER Comirnaty(Gray Top)Covid-19 Tri-Sucrose Vaccine 06/12/2020   PFIZER(Purple Top)SARS-COV-2 Vaccination 01/25/2019, 02/15/2019, 10/05/2019   Pneumococcal Conjugate-13 10/19/2004   Pneumococcal Polysaccharide-23 09/26/2012   Td 05/20/1997   Tdap 07/10/2019   Zoster Recombinant(Shingrix) 05/17/2017, 08/02/2017   Zoster, Live 01/04/2009    TDAP status: Up to date  Flu Vaccine status: Due, Education has been provided regarding the importance of this vaccine. Advised may receive this vaccine at local pharmacy or Health Dept. Aware to provide a  copy of the vaccination record if obtained from local pharmacy or Health Dept. Verbalized acceptance and understanding.  Pneumococcal  vaccine status: Up to date  Covid-19 vaccine status: Information provided on how to obtain vaccines.   Qualifies for Shingles Vaccine? Yes   Zostavax completed Yes   Shingrix Completed?: Yes  Screening Tests Health Maintenance  Topic Date Due   COVID-19 Vaccine (5 - 2023-24 season) 09/04/2021   INFLUENZA VACCINE  08/05/2022   Medicare Annual Wellness (AWV)  08/24/2023   DTaP/Tdap/Td (4 - Td or Tdap) 07/09/2029   Pneumonia Vaccine 46+ Years old  Completed   DEXA SCAN  Completed   Zoster Vaccines- Shingrix  Completed   HPV VACCINES  Aged Out    Health Maintenance  Health Maintenance Due  Topic Date Due   COVID-19 Vaccine (5 - 2023-24 season) 09/04/2021   INFLUENZA VACCINE  08/05/2022    Colorectal cancer screening: No longer required.   Mammogram status: No longer required due to age.  Bone Density status: Completed 06/26/2019.   Lung Cancer Screening: (Low Dose CT Chest recommended if Age 19-80 years, 20 pack-year currently smoking OR have quit w/in 15years.) does not qualify.   Lung Cancer Screening Referral: no  Additional Screening:  Hepatitis C Screening: does not qualify;   Vision Screening: Recommended annual ophthalmology exams for early detection of glaucoma and other disorders of the eye. Is the patient up to date with their annual eye exam?  Yes  Who is the provider or what is the name of the office in which the patient attends annual eye exams? Dr. Wynelle Link If pt is not established with a provider, would they like to be referred to a provider to establish care? No .   Dental Screening: Recommended annual dental exams for proper oral hygiene  Diabetic Foot Exam: n/a  Community Resource Referral / Chronic Care Management: CRR required this visit?  No   CCM required this visit?  No     Plan:     I have personally reviewed and noted the following in the patient's chart:   Medical and social history Use of alcohol, tobacco or illicit drugs  Current  medications and supplements including opioid prescriptions. Patient is not currently taking opioid prescriptions. Functional ability and status Nutritional status Physical activity Advanced directives List of other physicians Hospitalizations, surgeries, and ER visits in previous 12 months Vitals Screenings to include cognitive, depression, and falls Referrals and appointments  In addition, I have reviewed and discussed with patient certain preventive protocols, quality metrics, and best practice recommendations. A written personalized care plan for preventive services as well as general preventive health recommendations were provided to patient.     Barb Merino, LPN   1/61/0960   After Visit Summary: (Pick Up) Due to this being a telephonic visit, with patients personalized plan was offered to patient and patient has requested to Pick up at office.  Nurse Notes: none

## 2022-08-24 NOTE — Patient Instructions (Signed)
Tanya Berry , Thank you for taking time to come for your Medicare Wellness Visit. I appreciate your ongoing commitment to your health goals. Please review the following plan we discussed and let me know if I can assist you in the future.   Referrals/Orders/Follow-Ups/Clinician Recommendations: none  This is a list of the screening recommended for you and due dates:  Health Maintenance  Topic Date Due   COVID-19 Vaccine (5 - 2023-24 season) 09/04/2021   Flu Shot  08/05/2022   Medicare Annual Wellness Visit  08/24/2023   DTaP/Tdap/Td vaccine (4 - Td or Tdap) 07/09/2029   Pneumonia Vaccine  Completed   DEXA scan (bone density measurement)  Completed   Zoster (Shingles) Vaccine  Completed   HPV Vaccine  Aged Out    Advanced directives: (In Chart) A copy of your advanced directives are scanned into your chart should your provider ever need it.  Next Medicare Annual Wellness Visit scheduled for next year: Yes  Preventive Care 75 Years and Older, Female Preventive care refers to lifestyle choices and visits with your health care provider that can promote health and wellness. What does preventive care include? A yearly physical exam. This is also called an annual well check. Dental exams once or twice a year. Routine eye exams. Ask your health care provider how often you should have your eyes checked. Personal lifestyle choices, including: Daily care of your teeth and gums. Regular physical activity. Eating a healthy diet. Avoiding tobacco and drug use. Limiting alcohol use. Practicing safe sex. Taking low-dose aspirin every day. Taking vitamin and mineral supplements as recommended by your health care provider. What happens during an annual well check? The services and screenings done by your health care provider during your annual well check will depend on your age, overall health, lifestyle risk factors, and family history of disease. Counseling  Your health care provider may ask you  questions about your: Alcohol use. Tobacco use. Drug use. Emotional well-being. Home and relationship well-being. Sexual activity. Eating habits. History of falls. Memory and ability to understand (cognition). Work and work Astronomer. Reproductive health. Screening  You may have the following tests or measurements: Height, weight, and BMI. Blood pressure. Lipid and cholesterol levels. These may be checked every 5 years, or more frequently if you are over 13 years old. Skin check. Lung cancer screening. You may have this screening every year starting at age 32 if you have a 30-pack-year history of smoking and currently smoke or have quit within the past 15 years. Fecal occult blood test (FOBT) of the stool. You may have this test every year starting at age 52. Flexible sigmoidoscopy or colonoscopy. You may have a sigmoidoscopy every 5 years or a colonoscopy every 10 years starting at age 70. Hepatitis C blood test. Hepatitis B blood test. Sexually transmitted disease (STD) testing. Diabetes screening. This is done by checking your blood sugar (glucose) after you have not eaten for a while (fasting). You may have this done every 1-3 years. Bone density scan. This is done to screen for osteoporosis. You may have this done starting at age 61. Mammogram. This may be done every 1-2 years. Talk to your health care provider about how often you should have regular mammograms. Talk with your health care provider about your test results, treatment options, and if necessary, the need for more tests. Vaccines  Your health care provider may recommend certain vaccines, such as: Influenza vaccine. This is recommended every year. Tetanus, diphtheria, and acellular pertussis (Tdap,  Td) vaccine. You may need a Td booster every 10 years. Zoster vaccine. You may need this after age 14. Pneumococcal 13-valent conjugate (PCV13) vaccine. One dose is recommended after age 50. Pneumococcal polysaccharide  (PPSV23) vaccine. One dose is recommended after age 37. Talk to your health care provider about which screenings and vaccines you need and how often you need them. This information is not intended to replace advice given to you by your health care provider. Make sure you discuss any questions you have with your health care provider. Document Released: 01/17/2015 Document Revised: 09/10/2015 Document Reviewed: 10/22/2014 Elsevier Interactive Patient Education  2017 [employer].  Fall Prevention in the Home Falls can cause injuries. They can happen to people of all ages. There are many things you can do to make your home safe and to help prevent falls. What can I do on the outside of my home? Regularly fix the edges of walkways and driveways and fix any cracks. Remove anything that might make you trip as you walk through a door, such as a raised step or threshold. Trim any bushes or trees on the path to your home. Use bright outdoor lighting. Clear any walking paths of anything that might make someone trip, such as rocks or tools. Regularly check to see if handrails are loose or broken. Make sure that both sides of any steps have handrails. Any raised decks and porches should have guardrails on the edges. Have any leaves, snow, or ice cleared regularly. Use sand or salt on walking paths during winter. Clean up any spills in your garage right away. This includes oil or grease spills. What can I do in the bathroom? Use night lights. Install grab bars by the toilet and in the tub and shower. Do not use towel bars as grab bars. Use non-skid mats or decals in the tub or shower. If you need to sit down in the shower, use a plastic, non-slip stool. Keep the floor dry. Clean up any water that spills on the floor as soon as it happens. Remove soap buildup in the tub or shower regularly. Attach bath mats securely with double-sided non-slip rug tape. Do not have throw rugs and other things on the  floor that can make you trip. What can I do in the bedroom? Use night lights. Make sure that you have a light by your bed that is easy to reach. Do not use any sheets or blankets that are too big for your bed. They should not hang down onto the floor. Have a firm chair that has side arms. You can use this for support while you get dressed. Do not have throw rugs and other things on the floor that can make you trip. What can I do in the kitchen? Clean up any spills right away. Avoid walking on wet floors. Keep items that you use a lot in easy-to-reach places. If you need to reach something above you, use a strong step stool that has a grab bar. Keep electrical cords out of the way. Do not use floor polish or wax that makes floors slippery. If you must use wax, use non-skid floor wax. Do not have throw rugs and other things on the floor that can make you trip. What can I do with my stairs? Do not leave any items on the stairs. Make sure that there are handrails on both sides of the stairs and use them. Fix handrails that are broken or loose. Make sure that handrails are as  long as the stairways. Check any carpeting to make sure that it is firmly attached to the stairs. Fix any carpet that is loose or worn. Avoid having throw rugs at the top or bottom of the stairs. If you do have throw rugs, attach them to the floor with carpet tape. Make sure that you have a light switch at the top of the stairs and the bottom of the stairs. If you do not have them, ask someone to add them for you. What else can I do to help prevent falls? Wear shoes that: Do not have high heels. Have rubber bottoms. Are comfortable and fit you well. Are closed at the toe. Do not wear sandals. If you use a stepladder: Make sure that it is fully opened. Do not climb a closed stepladder. Make sure that both sides of the stepladder are locked into place. Ask someone to hold it for you, if possible. Clearly mark and make  sure that you can see: Any grab bars or handrails. First and last steps. Where the edge of each step is. Use tools that help you move around (mobility aids) if they are needed. These include: Canes. Walkers. Scooters. Crutches. Turn on the lights when you go into a dark area. Replace any light bulbs as soon as they burn out. Set up your furniture so you have a clear path. Avoid moving your furniture around. If any of your floors are uneven, fix them. If there are any pets around you, be aware of where they are. Review your medicines with your doctor. Some medicines can make you feel dizzy. This can increase your chance of falling. Ask your doctor what other things that you can do to help prevent falls. This information is not intended to replace advice given to you by your health care provider. Make sure you discuss any questions you have with your health care provider. Document Released: 10/17/2008 Document Revised: 05/29/2015 Document Reviewed: 01/25/2014 Elsevier Interactive Patient Education  2017 [employer].

## 2022-09-04 ENCOUNTER — Other Ambulatory Visit: Payer: Self-pay | Admitting: Family Medicine

## 2022-09-04 DIAGNOSIS — K219 Gastro-esophageal reflux disease without esophagitis: Secondary | ICD-10-CM

## 2022-09-14 ENCOUNTER — Ambulatory Visit (INDEPENDENT_AMBULATORY_CARE_PROVIDER_SITE_OTHER): Payer: Medicare Other | Admitting: Family Medicine

## 2022-09-14 ENCOUNTER — Encounter: Payer: Self-pay | Admitting: Family Medicine

## 2022-09-14 VITALS — BP 132/74 | HR 75 | Ht 63.0 in | Wt 149.2 lb

## 2022-09-14 DIAGNOSIS — H4089 Other specified glaucoma: Secondary | ICD-10-CM | POA: Diagnosis not present

## 2022-09-14 DIAGNOSIS — G8929 Other chronic pain: Secondary | ICD-10-CM | POA: Diagnosis not present

## 2022-09-14 DIAGNOSIS — I2510 Atherosclerotic heart disease of native coronary artery without angina pectoris: Secondary | ICD-10-CM

## 2022-09-14 DIAGNOSIS — N1831 Chronic kidney disease, stage 3a: Secondary | ICD-10-CM | POA: Diagnosis not present

## 2022-09-14 DIAGNOSIS — K219 Gastro-esophageal reflux disease without esophagitis: Secondary | ICD-10-CM

## 2022-09-14 DIAGNOSIS — M545 Low back pain, unspecified: Secondary | ICD-10-CM | POA: Diagnosis not present

## 2022-09-14 DIAGNOSIS — Z23 Encounter for immunization: Secondary | ICD-10-CM

## 2022-09-14 DIAGNOSIS — H9113 Presbycusis, bilateral: Secondary | ICD-10-CM | POA: Diagnosis not present

## 2022-09-14 DIAGNOSIS — I10 Essential (primary) hypertension: Secondary | ICD-10-CM

## 2022-09-14 DIAGNOSIS — E785 Hyperlipidemia, unspecified: Secondary | ICD-10-CM | POA: Diagnosis not present

## 2022-09-14 DIAGNOSIS — M810 Age-related osteoporosis without current pathological fracture: Secondary | ICD-10-CM

## 2022-09-14 MED ORDER — AMLODIPINE BESYLATE 5 MG PO TABS: 5.0000 mg | ORAL_TABLET | Freq: Every day | ORAL | 3 refills | Status: DC

## 2022-09-14 MED ORDER — ROSUVASTATIN CALCIUM 20 MG PO TABS: 20.0000 mg | ORAL_TABLET | Freq: Every day | ORAL | 3 refills | Status: DC

## 2022-09-14 MED ORDER — METOPROLOL SUCCINATE ER 25 MG PO TB24: 25.0000 mg | ORAL_TABLET | Freq: Two times a day (BID) | ORAL | 3 refills | Status: DC

## 2022-09-14 MED ORDER — LOSARTAN POTASSIUM-HCTZ 50-12.5 MG PO TABS: 1.0000 | ORAL_TABLET | Freq: Every day | ORAL | 3 refills | Status: DC

## 2022-09-14 MED ORDER — DENOSUMAB 60 MG/ML ~~LOC~~ SOSY
60.0000 mg | PREFILLED_SYRINGE | Freq: Once | SUBCUTANEOUS | Status: AC
Start: 2022-09-14 — End: 2022-09-14
  Administered 2022-09-14: 60 mg via SUBCUTANEOUS

## 2022-09-14 NOTE — Patient Instructions (Signed)
Look into joining the [employer] and start doing water aerobics.

## 2022-09-14 NOTE — Progress Notes (Signed)
Tanya Berry is a 87 y.o. female who presents for annual wellness visit and follow-up on chronic medical conditions.  She has no particular concerns or complaints.  She was switched to Crestor so we need to follow-up on that.  She was also taking Toprol twice per day however review of the record indicates that she should be taking the second dosing on an as-needed basis when she has palpitations.  I conveyed this information to her.  She uses Prilosec for her reflux.  She follows up regularly with ophthalmology concerning her underlying glaucoma.  Does wear her hearing aids.  Continues on Prolia for her osteoporosis.  She does have back pain but this is usually only when she goes from a sitting to a standing position and this discomfort goes quickly.  Immunizations and Health Maintenance Immunization History  Administered Date(s) Administered   DTaP 03/22/2008   Fluad Quad(high Dose 65+) 10/02/2018, 10/23/2019, 10/28/2020   Influenza Split 10/27/2010, 10/27/2011   Influenza Whole 09/23/2009   Influenza, High Dose Seasonal PF 10/01/2013, 12/17/2014, 11/11/2015, 10/05/2016, 01/25/2018   Influenza,inj,Quad PF,6+ Mos 09/26/2012, 12/18/2021   PFIZER Comirnaty(Gray Top)Covid-19 Tri-Sucrose Vaccine 06/12/2020   PFIZER(Purple Top)SARS-COV-2 Vaccination 01/25/2019, 02/15/2019, 10/05/2019   Pneumococcal Conjugate-13 10/19/2004   Pneumococcal Polysaccharide-23 09/26/2012   Td 05/20/1997   Tdap 07/10/2019   Zoster Recombinant(Shingrix) 05/17/2017, 08/02/2017   Zoster, Live 01/04/2009   Health Maintenance Due  Topic Date Due   INFLUENZA VACCINE  08/05/2022    Last Pap smear: 2008 Last mammogram: 2015 Last colonoscopy:N/A Last [id number] Dentist: Dr Mayford Knife Ophtho:Dr Su Exercise:minimal  Other doctors caring for patient include:Sun H Smith  Advanced directives:done    Depression screen:  See questionnaire below.     09/14/2022    8:10 AM 08/24/2022    8:54 AM 06/26/2021    8:17 AM 06/12/2020     9:38 AM 06/12/2019    9:37 AM  Depression screen PHQ 2/9  Decreased Interest 0 0 0 0 0  Down, Depressed, Hopeless 0 0 0 0 0  PHQ - 2 Score 0 0 0 0 0  Altered sleeping 0 0     Tired, decreased energy 0 0     Change in appetite 0 0     Feeling bad or failure about yourself  0 0     Trouble concentrating 0 0     Moving slowly or fidgety/restless 0 0     Suicidal thoughts 0 0     PHQ-9 Score 0 0     Difficult doing work/chores Not difficult at all Not difficult at all       Fall Risk Screen: see questionnaire below.    09/14/2022    8:10 AM 08/24/2022    8:53 AM 06/26/2021    8:17 AM 06/12/2020    9:38 AM 06/12/2019    9:37 AM  Fall Risk   Falls in the past year? 0 0 0 0 0  Number falls in past yr: 0 0 0 0   Injury with Fall? 0 0 0 0   Risk for fall due to :  Medication side effect Medication side effect No Fall Risks   Follow up Falls evaluation completed Falls prevention discussed;Falls evaluation completed Falls evaluation completed;Education provided;Falls prevention discussed Falls evaluation completed     ADL screen:  See questionnaire below Functional Status Survey:     Review of Systems Constitutional: -, -unexpected weight change, -anorexia, -fatigue Allergy: -sneezing, -itching, -congestion Dermatology: denies changing moles, rash, lumps ENT: -runny  nose, -ear pain, -sore throat,  Cardiology:  -chest pain, -palpitations, -orthopnea, Respiratory: -cough, -shortness of breath, -dyspnea on exertion, -wheezing,  Gastroenterology: -abdominal pain, -nausea, -vomiting, -diarrhea, -constipation, -dysphagia Hematology: -bleeding or bruising problems Musculoskeletal: -arthralgias, -myalgias, -joint swelling, -back pain, - Ophthalmology: -vision changes,  Urology: -dysuria, -difficulty urinating,  -urinary frequency, -urgency, incontinence Neurology: -, -numbness, , -memory loss, -falls, -dizziness    PHYSICAL EXAM:  BP 132/74   Pulse 75   Ht 5\' 3"  (1.6 m)   Wt 149 lb  3.2 oz (67.7 kg)   SpO2 96%   BMI 26.43 kg/m   General Appearance: Alert, cooperative, no distress, appears stated age Head: Normocephalic, without obvious abnormality, atraumatic Eyes: PERRL, conjunctiva/corneas clear, EOM's intact,  Ears: Normal TM's and external ear canals Nose: Nares normal, mucosa normal, no drainage or sinus tenderness Throat: Lips, mucosa, and tongue normal; teeth and gums normal Neck: Supple, no lymphadenopathy;  thyroid:  no enlargement/tenderness/nodules; no carotid bruit or JVD Lungs: Clear to auscultation bilaterally without wheezes, rales or ronchi; respirations unlabored Heart: Regular rate and rhythm, S1 and S2 normal, no murmur, rubor gallop Skin:  Skin color, texture, turgor normal, no rashes or lesions Lymph nodes: Cervical, supraclavicular, and axillary nodes normal Neurologic:  CNII-XII intact, normal strength, sensation and gait; reflexes 2+ and symmetric throughout Psych: Normal mood, affect, hygiene and grooming.  ASSESSMENT/PLAN: Stage 3a chronic kidney disease (HCC) - Plan: CBC with Differential/Platelet, Comprehensive metabolic panel  Presbycusis of both ears  Osteoporosis, unspecified osteoporosis type, unspecified pathological fracture presence - Plan: DG Bone Density  Primary hypertension - Plan: amLODipine (NORVASC) 5 MG tablet, losartan-hydrochlorothiazide (HYZAAR) 50-12.5 MG tablet, metoprolol succinate (TOPROL XL) 25 MG 24 hr tablet, CBC with Differential/Platelet, Comprehensive metabolic panel  Hyperlipidemia with target LDL less than 70 - Plan: rosuvastatin (CRESTOR) 20 MG tablet, Lipid panel  Other glaucoma of both eyes  Gastroesophageal reflux disease without esophagitis  ASHD (arteriosclerotic heart disease) - Plan: metoprolol succinate (TOPROL XL) 25 MG 24 hr tablet, rosuvastatin (CRESTOR) 20 MG tablet  Chronic midline low back pain without sciatica  Essential hypertension - Plan: amLODipine (NORVASC) 5 MG tablet,  losartan-hydrochlorothiazide (HYZAAR) 50-12.5 MG tablet  Need for influenza vaccination - Plan: Flu Vaccine Trivalent High Dose (Fluad)    Discussed  at least 20 minutes of aerobic activity at least 5 days/week and weight-bearing exercise 2x/week;   Immunization recommendations discussed.  Discussed COVID-vaccine which she does not want but did recommend RSV. Medicare Attestation I have personally reviewed: The patient's medical and social history Their use of alcohol, tobacco or illicit drugs Their current medications and supplements The patient's functional ability including ADLs,fall risks, home safety risks, cognitive, and hearing and visual impairment Diet and physical activities Evidence for depression or mood disorders  The patient's weight, height, and BMI have been recorded in the chart.  I have made referrals, counseling, and provided education to the patient based on review of the above and I have provided the patient with a written personalized care plan for preventive services.     Sharlot Gowda, MD   09/14/2022

## 2022-09-15 LAB — LIPID PANEL
Chol/HDL Ratio: 2.2 ratio (ref 0.0–4.4)
Cholesterol, Total: 152 mg/dL (ref 100–199)
HDL: 69 mg/dL (ref >39)
LDL Chol Calc (NIH): 65 mg/dL (ref 0–99)
Triglycerides: 99 mg/dL (ref 0–149)
VLDL Cholesterol Cal: 18 mg/dL (ref 5–40)

## 2022-09-15 LAB — CBC WITH DIFFERENTIAL/PLATELET
Basophils Absolute: 0 10*3/uL (ref 0.0–0.2)
Basos: 1 %
EOS (ABSOLUTE): 0.1 10*3/uL (ref 0.0–0.4)
Eos: 3 %
Hematocrit: 44.2 % (ref 34.0–46.6)
Hemoglobin: 14.7 g/dL (ref 11.1–15.9)
Immature Grans (Abs): 0 10*3/uL (ref 0.0–0.1)
Immature Granulocytes: 0 %
Lymphocytes Absolute: 1.2 10*3/uL (ref 0.7–3.1)
Lymphs: 27 %
MCH: 29.2 pg (ref 26.6–33.0)
MCHC: 33.3 g/dL (ref 31.5–35.7)
MCV: 88 fL (ref 79–97)
Monocytes Absolute: 0.5 10*3/uL (ref 0.1–0.9)
Monocytes: 11 %
Neutrophils Absolute: 2.6 10*3/uL (ref 1.4–7.0)
Neutrophils: 58 %
Platelets: 232 10*3/uL (ref 150–450)
RBC: 5.04 x10E6/uL (ref 3.77–5.28)
RDW: 13.6 % (ref 11.7–15.4)
WBC: 4.6 10*3/uL (ref 3.4–10.8)

## 2022-09-15 LAB — COMPREHENSIVE METABOLIC PANEL
ALT: 22 IU/L (ref 0–32)
AST: 25 IU/L (ref 0–40)
Albumin: 4.5 g/dL (ref 3.7–4.7)
Alkaline Phosphatase: 66 IU/L (ref 44–121)
BUN/Creatinine Ratio: 13 (ref 12–28)
BUN: 16 mg/dL (ref 8–27)
Bilirubin Total: 0.5 mg/dL (ref 0.0–1.2)
CO2: 21 mmol/L (ref 20–29)
Calcium: 9.7 mg/dL (ref 8.7–10.3)
Chloride: 102 mmol/L (ref 96–106)
Creatinine, Ser: 1.25 mg/dL — ABNORMAL HIGH (ref 0.57–1.00)
Globulin, Total: 2.8 g/dL (ref 1.5–4.5)
Glucose: 98 mg/dL (ref 70–99)
Potassium: 4.2 mmol/L (ref 3.5–5.2)
Sodium: 141 mmol/L (ref 134–144)
Total Protein: 7.3 g/dL (ref 6.0–8.5)
eGFR: 42 mL/min/{1.73_m2} — ABNORMAL LOW (ref >59)

## 2022-09-21 DIAGNOSIS — H43813 Vitreous degeneration, bilateral: Secondary | ICD-10-CM | POA: Diagnosis not present

## 2022-09-21 DIAGNOSIS — H35372 Puckering of macula, left eye: Secondary | ICD-10-CM | POA: Diagnosis not present

## 2022-09-21 DIAGNOSIS — Z961 Presence of intraocular lens: Secondary | ICD-10-CM | POA: Diagnosis not present

## 2022-09-21 DIAGNOSIS — H401134 Primary open-angle glaucoma, bilateral, indeterminate stage: Secondary | ICD-10-CM | POA: Diagnosis not present

## 2022-10-07 ENCOUNTER — Other Ambulatory Visit: Payer: Self-pay

## 2022-10-07 ENCOUNTER — Telehealth: Payer: Self-pay | Admitting: Family Medicine

## 2022-10-07 DIAGNOSIS — K219 Gastro-esophageal reflux disease without esophagitis: Secondary | ICD-10-CM

## 2022-10-07 MED ORDER — OMEPRAZOLE 20 MG PO CPDR
20.0000 mg | DELAYED_RELEASE_CAPSULE | Freq: Every day | ORAL | 1 refills | Status: DC
Start: 2022-10-07 — End: 2023-03-14

## 2022-10-07 NOTE — Telephone Encounter (Signed)
Fax refill request from [facility]  Omeprazole  20 mg 90 day supply

## 2023-01-06 ENCOUNTER — Telehealth: Payer: Self-pay | Admitting: Internal Medicine

## 2023-01-06 DIAGNOSIS — M810 Age-related osteoporosis without current pathological fracture: Secondary | ICD-10-CM

## 2023-01-06 MED ORDER — DENOSUMAB 60 MG/ML ~~LOC~~ SOSY
60.0000 mg | PREFILLED_SYRINGE | Freq: Once | SUBCUTANEOUS | Status: AC
Start: 2023-03-15 — End: 2023-03-22
  Administered 2023-03-22: 60 mg via SUBCUTANEOUS

## 2023-01-06 NOTE — Telephone Encounter (Signed)
 See prolia referral  Pt is due on or after 03/15/2023

## 2023-02-18 DIAGNOSIS — H04123 Dry eye syndrome of bilateral lacrimal glands: Secondary | ICD-10-CM | POA: Diagnosis not present

## 2023-02-18 DIAGNOSIS — H401131 Primary open-angle glaucoma, bilateral, mild stage: Secondary | ICD-10-CM | POA: Diagnosis not present

## 2023-02-18 DIAGNOSIS — H35372 Puckering of macula, left eye: Secondary | ICD-10-CM | POA: Diagnosis not present

## 2023-02-18 DIAGNOSIS — H35342 Macular cyst, hole, or pseudohole, left eye: Secondary | ICD-10-CM | POA: Diagnosis not present

## 2023-02-22 ENCOUNTER — Telehealth: Payer: Self-pay

## 2023-02-22 NOTE — Telephone Encounter (Signed)
 Prolia VOB initiated via [email or web address]  Next Prolia inj DUE: 03/14/23

## 2023-02-28 NOTE — Telephone Encounter (Signed)
 Pt ready for scheduling for PROLIA on or after : 03/14/23  Out-of-pocket cost due at time of visit: $0  Number of injection/visits approved: ---  Primary: MEDICARE Prolia co-insurance: 0% Admin fee co-insurance: 0%  Secondary: TRICARE FOR LIFE Prolia co-insurance: The secondary plan will consider the Medicare Part B deductible and co-insurance Admin fee co-insurance:   Medical Benefit Details: Date Benefits were checked: 02/25/23 Deductible: $0 Met of $257 Required/ Coinsurance: 0%/ Admin Fee: 0%  Prior Auth: N/A PA# Expiration Date:   # of doses approved:  Pharmacy benefit: Copay $--- If patient wants fill through the pharmacy benefit please send prescription to:  --- , and include estimated need by date in rx notes. Pharmacy will ship medication directly to the office.  Patient NOT eligible for Prolia Copay Card. Copay Card can make patient's cost as little as $25. Link to apply: [email or web address]://www.[email or web address].[email or web address]/copay  ** This summary of benefits is an estimation of the patient's out-of-pocket cost. Exact cost may very based on individual plan coverage.

## 2023-02-28 NOTE — Telephone Encounter (Signed)
 Marland Kitchen

## 2023-03-01 DIAGNOSIS — H35433 Paving stone degeneration of retina, bilateral: Secondary | ICD-10-CM | POA: Diagnosis not present

## 2023-03-01 DIAGNOSIS — H35372 Puckering of macula, left eye: Secondary | ICD-10-CM | POA: Diagnosis not present

## 2023-03-01 DIAGNOSIS — H26493 Other secondary cataract, bilateral: Secondary | ICD-10-CM | POA: Diagnosis not present

## 2023-03-01 DIAGNOSIS — H35361 Drusen (degenerative) of macula, right eye: Secondary | ICD-10-CM | POA: Diagnosis not present

## 2023-03-14 ENCOUNTER — Other Ambulatory Visit: Payer: Self-pay | Admitting: Family Medicine

## 2023-03-14 DIAGNOSIS — K219 Gastro-esophageal reflux disease without esophagitis: Secondary | ICD-10-CM

## 2023-03-14 MED ORDER — OMEPRAZOLE 20 MG PO CPDR: 20.0000 mg | DELAYED_RELEASE_CAPSULE | Freq: Every day | ORAL | 0 refills | Status: DC

## 2023-03-14 NOTE — Telephone Encounter (Signed)
 Copied from CRM [id number]. Topic: Clinical - Medication Refill >> Mar 14, 2023  9:17 AM Almira Coaster wrote: Most Recent Primary Care Visit:  Provider: [name]  Department: [facility]  Visit Type: MEDICARE WELL VISIT 45  Date: 09/14/2022  Medication: omeprazole (PRILOSEC) 20 MG capsule  Has the patient contacted their pharmacy? Yes, they advised patient to contact the ordering provider (Agent: If no, request that the patient contact the pharmacy for the refill. If patient does not wish to contact the pharmacy document the reason why and proceed with request.) (Agent: If yes, when and what did the pharmacy advise?)  Is this the correct pharmacy for this prescription? Yes If no, delete pharmacy and type the correct one.  This is the patient's preferred pharmacy:  [facility] - [place], [place] - [address] [place] [address] Phone: [phone] Fax: [phone]   Has the prescription been filled recently? No  Is the patient out of the medication? No  Has the patient been seen for an appointment in the last year OR does the patient have an upcoming appointment? Yes  Can we respond through MyChart? No  Agent: Please be advised that Rx refills may take up to 3 business days. We ask that you follow-up with your pharmacy.

## 2023-03-22 ENCOUNTER — Other Ambulatory Visit: Payer: Medicare Other

## 2023-03-22 DIAGNOSIS — M810 Age-related osteoporosis without current pathological fracture: Secondary | ICD-10-CM

## 2023-03-31 DIAGNOSIS — H35372 Puckering of macula, left eye: Secondary | ICD-10-CM | POA: Diagnosis not present

## 2023-03-31 DIAGNOSIS — H3581 Retinal edema: Secondary | ICD-10-CM | POA: Diagnosis not present

## 2023-03-31 DIAGNOSIS — H33332 Multiple defects of retina without detachment, left eye: Secondary | ICD-10-CM | POA: Diagnosis not present

## 2023-03-31 DIAGNOSIS — H353122 Nonexudative age-related macular degeneration, left eye, intermediate dry stage: Secondary | ICD-10-CM | POA: Diagnosis not present

## 2023-03-31 DIAGNOSIS — H353131 Nonexudative age-related macular degeneration, bilateral, early dry stage: Secondary | ICD-10-CM | POA: Diagnosis not present

## 2023-04-12 DIAGNOSIS — Z9889 Other specified postprocedural states: Secondary | ICD-10-CM | POA: Diagnosis not present

## 2023-04-12 DIAGNOSIS — H35372 Puckering of macula, left eye: Secondary | ICD-10-CM | POA: Diagnosis not present

## 2023-05-02 ENCOUNTER — Encounter: Payer: Self-pay | Admitting: Cardiology

## 2023-05-02 ENCOUNTER — Ambulatory Visit: Payer: Medicare Other | Attending: Cardiology | Admitting: Cardiology

## 2023-05-02 VITALS — BP 132/70 | HR 72 | Ht 62.5 in | Wt 150.4 lb

## 2023-05-02 DIAGNOSIS — I471 Supraventricular tachycardia, unspecified: Secondary | ICD-10-CM | POA: Insufficient documentation

## 2023-05-02 DIAGNOSIS — I2510 Atherosclerotic heart disease of native coronary artery without angina pectoris: Secondary | ICD-10-CM | POA: Insufficient documentation

## 2023-05-02 DIAGNOSIS — I10 Essential (primary) hypertension: Secondary | ICD-10-CM | POA: Diagnosis not present

## 2023-05-02 DIAGNOSIS — N1831 Chronic kidney disease, stage 3a: Secondary | ICD-10-CM | POA: Diagnosis not present

## 2023-05-02 DIAGNOSIS — I447 Left bundle-branch block, unspecified: Secondary | ICD-10-CM | POA: Diagnosis not present

## 2023-05-02 NOTE — Patient Instructions (Signed)
 Medication Instructions:  Your physician recommends that you continue on your current medications as directed. Please refer to the Current Medication list given to you today.  *If you need a refill on your cardiac medications before your next appointment, please call your pharmacy*  Lab Work: None ordered.  If you have labs (blood work) drawn today and your tests are completely normal, you will receive your results only by: MyChart Message (if you have MyChart) OR A paper copy in the mail If you have any lab test that is abnormal or we need to change your treatment, we will call you to review the results.  Testing/Procedures: None ordered.   Follow-Up: At [facility], you and your health needs are our priority.  As part of our continuing mission to provide you with exceptional heart care, our providers are all part of one team.  This team includes your primary Cardiologist (physician) and Advanced Practice Providers or APPs (Physician Assistants and Nurse Practitioners) who all work together to provide you with the care you need, when you need it.  Your next appointment:  Follow up with Dr Renna Cary as needed.

## 2023-05-02 NOTE — Progress Notes (Signed)
 Cardiology Office Note:  .   Date:  05/02/2023  ID:  Tanya Berry, DOB 03-30-1935, MRN [id number] PCP: Watson Hacking, MD  [facility] Providers Cardiologist:  None    History of Present Illness: .   Tanya Berry is a 88 y.o. female Discussed the use of AI scribe software for clinical note transcription with the patient, who gave verbal consent to proceed.  History of Present Illness Tanya Berry is an 88 year old female with left bundle branch block and coronary artery disease who presents for follow-up.  She has a history of left bundle branch block and coronary artery disease, with a bare metal stent placed in the proximal LAD in 2002. She has been feeling much better with additional doses of Toprol  XL and has experienced no further dizziness. She remains active and reports doing well overall.  Previously, she experienced episodes of dizziness, described as vertigo-like when turning her head, which have resolved with the increased dose of metoprolol . A cardiac monitor previously showed 25 SVT salvos, the longest being 17 beats, all atrial in nature. Her metoprolol  was increased from 20 to 25 mg twice a day at that time, which has helped manage her symptoms.  Her echocardiogram shows an ejection fraction of 65%. Her laboratory results include an LDL of 65, HDL of 69, hemoglobin of 14.7, creatinine of 1.25, and TSH of 12.5. She is currently taking metoprolol , Crestor  20 mg daily, losartan  hydrochlorothiazide  (Hyzaar) 50/12.5 mg daily, and amlodipine  5 mg daily. She also takes aspirin 81 mg for heart health.  She recalls a brief episode of heaviness that lasted only a few seconds, which she managed by taking her heart medication. She is compliant with her medication regimen and reports no significant issues with her current treatment plan.       Studies Reviewed: Aaron Aas   EKG Interpretation Date/Time:  Monday May 02 2023 10:39:15 EDT Ventricular Rate:  72 PR Interval:  146 QRS  Duration:  120 QT Interval:  380 QTC Calculation: 416 R Axis:   -51  Text Interpretation: Normal sinus rhythm  Left bundle branch block  No significant change since last tracing  Confirmed by Dorothye Gathers ([id number]) on 05/02/2023 10:47:19 AM    Results LABS LDL: 65 mg/dL HDL: 69 mg/dL Hemoglobin: 19.1 g/dL Creatinine: 4.78 mg/dL TSH: 29.5 IU/mL  DIAGNOSTIC Echocardiogram: Ejection Fraction (EF) 65% Electrocardiogram (EKG): Stable with left bundle branch block Risk Assessment/Calculations:            Physical Exam:   VS:  BP 132/70   Pulse 72   Ht 5' 2.5" (1.588 m)   Wt 150 lb 6.4 oz (68.2 kg)   SpO2 96%   BMI 27.07 kg/m    Wt Readings from Last 3 Encounters:  05/02/23 150 lb 6.4 oz (68.2 kg)  09/14/22 149 lb 3.2 oz (67.7 kg)  04/20/22 149 lb 9.6 oz (67.9 kg)    GEN: Well nourished, well developed in no acute distress NECK: No JVD; No carotid bruits CARDIAC: RRR, no murmurs, no rubs, no gallops RESPIRATORY:  Clear to auscultation without rales, wheezing or rhonchi  ABDOMEN: Soft, non-tender, non-distended EXTREMITIES:  No edema; No deformity   ASSESSMENT AND PLAN: .    Assessment and Plan Assessment & Plan Coronary artery disease with stent Coronary artery disease with a bare metal stent placed in the proximal LAD in 2002. LDL is well-controlled at 65 mg/dL. She is asymptomatic with no recent episodes of angina. Continues aspirin  and Crestor  for cholesterol management. - Continue aspirin 81 mg daily. - Continue Crestor  20 mg daily for cholesterol management.  Supraventricular tachycardia Supraventricular tachycardia with 25 SVT salvos on cardiac monitor, longest being 17 beats. Well-controlled with metoprolol , with no recent dizziness or palpitations. Metoprolol  dosage increased to 25 mg XL daily, effectively controlling symptoms. - Continue metoprolol  25 mg XL daily.  Dizziness Intermittent dizziness likely related to SVT episodes. No recent dizziness since  metoprolol  dosage adjustment. Reports improvement with current regimen.  Left bundle branch block Chronic left bundle branch block, known since 2001. EKG shows no new changes.  Follow-Up She is well-managed with current treatment. No immediate follow-up needed unless symptoms change. - Graduate from regular cardiology follow-ups to as-needed basis. - Contact cardiology if new symptoms arise or if primary care physician advises.           Signed, Dorothye Gathers, MD

## 2023-05-03 DIAGNOSIS — Z9889 Other specified postprocedural states: Secondary | ICD-10-CM | POA: Diagnosis not present

## 2023-05-03 DIAGNOSIS — H35372 Puckering of macula, left eye: Secondary | ICD-10-CM | POA: Diagnosis not present

## 2023-05-06 ENCOUNTER — Other Ambulatory Visit: Payer: Self-pay | Admitting: Internal Medicine

## 2023-05-06 NOTE — Progress Notes (Signed)
 Ordered prolia  and sent to [facility] going forward

## 2023-05-09 ENCOUNTER — Telehealth: Payer: Self-pay

## 2023-05-09 NOTE — Telephone Encounter (Signed)
 Dr. Robina Chol and [name], patient will be scheduled as soon as possible.  Auth Submission: NO AUTH NEEDED Site of care: Site of care: [facility] Payer: Medicare A/B with Tricare for Life Medication & CPT/J Code(s) submitted: Prolia  (Denosumab ) [id number] Route of submission (phone, fax, portal):  Phone # Fax # Auth type: Buy/Bill PB Units/visits requested: 60mg  x 2 doses Reference number:  Approval from: 05/09/23 to 02/04/24

## 2023-07-30 ENCOUNTER — Other Ambulatory Visit: Payer: Self-pay | Admitting: Family Medicine

## 2023-07-30 DIAGNOSIS — K219 Gastro-esophageal reflux disease without esophagitis: Secondary | ICD-10-CM

## 2023-08-26 ENCOUNTER — Encounter

## 2023-09-03 ENCOUNTER — Other Ambulatory Visit: Payer: Self-pay | Admitting: Family Medicine

## 2023-09-03 DIAGNOSIS — I10 Essential (primary) hypertension: Secondary | ICD-10-CM

## 2023-09-14 ENCOUNTER — Telehealth: Payer: Self-pay | Admitting: Family Medicine

## 2023-09-14 NOTE — Telephone Encounter (Signed)
 Copied from CRM #[id number]. Topic: Appointments - Scheduling Inquiry for Clinic >> Sep 13, 2023  9:46 AM Graeme ORN wrote: Reason for CRM: Patient called. Last physical 9/10. Wanted to schedule physical appt. Only options are AWV and Office Visit. Not able to schedule AWV with provider. Patient would like go ahead and schedule what visit is needed for annual visit so she can keep getting her medication. Thank You    ----------------------------------------------------------------------- From previous Reason for Contact - Scheduling: Patient/patient representative is calling to schedule an appointment. Refer to attachments for appointment information.  Left message for Sage to call me back

## 2023-09-29 ENCOUNTER — Other Ambulatory Visit: Payer: Self-pay

## 2023-09-29 ENCOUNTER — Telehealth: Payer: Self-pay | Admitting: Family Medicine

## 2023-09-29 DIAGNOSIS — I10 Essential (primary) hypertension: Secondary | ICD-10-CM

## 2023-09-29 MED ORDER — LOSARTAN POTASSIUM-HCTZ 50-12.5 MG PO TABS: 1.0000 | ORAL_TABLET | Freq: Every day | ORAL | 0 refills | Status: DC

## 2023-09-29 NOTE — Telephone Encounter (Signed)
 [facility] Losartan /hydrochlorothiazide  50/12.5  90 day supply

## 2023-09-30 ENCOUNTER — Other Ambulatory Visit: Payer: Self-pay | Admitting: Family Medicine

## 2023-09-30 DIAGNOSIS — E785 Hyperlipidemia, unspecified: Secondary | ICD-10-CM

## 2023-09-30 DIAGNOSIS — I2510 Atherosclerotic heart disease of native coronary artery without angina pectoris: Secondary | ICD-10-CM

## 2023-10-03 ENCOUNTER — Telehealth: Payer: Self-pay | Admitting: Internal Medicine

## 2023-10-03 ENCOUNTER — Other Ambulatory Visit: Payer: Self-pay | Admitting: Family Medicine

## 2023-10-03 ENCOUNTER — Other Ambulatory Visit: Payer: Self-pay | Admitting: Internal Medicine

## 2023-10-03 DIAGNOSIS — I10 Essential (primary) hypertension: Secondary | ICD-10-CM

## 2023-10-03 MED ORDER — LOSARTAN POTASSIUM-HCTZ 50-12.5 MG PO TABS: 1.0000 | ORAL_TABLET | Freq: Every day | ORAL | 0 refills | Status: DC

## 2023-10-03 NOTE — Telephone Encounter (Signed)
 Patient has not had her Calcium  done within a year. They want an Ok to continue with Prolia  or does she need to cancel her prolia  and do blood work first.    Archivist to Dr. Vita as a backup. They need to know today or they will have to cancel her appt.     Copied from CRM [id number]. Topic: Clinical - Lab/Test Results >> Oct 03, 2023 10:31 AM Willma SAUNDERS wrote: Reason for CRM: Leita from [facility] calling to talk to someone clinical in regards to patients lab work.  Leita can be reached at [phone] >> Oct 03, 2023  1:37 PM Willma SAUNDERS wrote: Leita calling in again regarding patients labs. Would like to speak to someone as soon as possible so they can cancel her appointment if they need to.

## 2023-10-03 NOTE — Telephone Encounter (Signed)
 Already refilled recently

## 2023-10-03 NOTE — Telephone Encounter (Signed)
 Copied from CRM [id number]. Topic: Clinical - Medication Refill >> Oct 03, 2023  9:40 AM Drema MATSU wrote: Medication:  losartan -hydrochlorothiazide  (HYZAAR) 50-12.5 MG tablet   Has the patient contacted their pharmacy? Yes (Agent: If no, request that the patient contact the pharmacy for the refill. If patient does not wish to contact the pharmacy document the reason why and proceed with request.)  [facility] medication will be here in about 5 days she is needing only 5 pills until it arrives  (Agent: If yes, when and what did the pharmacy advise?)  This is the patient's preferred pharmacy:  [facility] on [address]   Is this the correct pharmacy for this prescription? Yes If no, delete pharmacy and type the correct one.   Has the prescription been filled recently? no  Is the patient out of the medication? Yes patient is needing only 5 pills until her medication comes  Has the patient been seen for an appointment in the last year OR does the patient have an upcoming appointment? Yes  Can we respond through MyChart? No  Agent: Please be advised that Rx refills may take up to 3 business days. We ask that you follow-up with your pharmacy.

## 2023-10-03 NOTE — Telephone Encounter (Signed)
 Notified Tanya Berry that its ok to continue

## 2023-10-04 ENCOUNTER — Ambulatory Visit (INDEPENDENT_AMBULATORY_CARE_PROVIDER_SITE_OTHER)

## 2023-10-04 VITALS — BP 145/81 | HR 73 | Temp 97.7°F | Resp 18 | Ht 63.0 in | Wt 150.0 lb

## 2023-10-04 DIAGNOSIS — M810 Age-related osteoporosis without current pathological fracture: Secondary | ICD-10-CM | POA: Diagnosis not present

## 2023-10-04 MED ORDER — DENOSUMAB 60 MG/ML ~~LOC~~ SOSY
60.0000 mg | PREFILLED_SYRINGE | Freq: Once | SUBCUTANEOUS | Status: AC
  Administered 2023-10-04: 60 mg via SUBCUTANEOUS

## 2023-10-04 NOTE — Progress Notes (Signed)
 Diagnosis: Osteoporosis  Provider:  Mannam, Praveen MD  Procedure: Injection  Prolia  (Denosumab ), Dose: 60 mg, Site: subcutaneous, Number of injections: 1  Injection Site(s): Right arm  Post Care: Patient declined observation  Discharge: Condition: Good, Destination: Home . AVS Provided  Performed by:  Rachelle Bue, RN

## 2023-10-10 DIAGNOSIS — H35342 Macular cyst, hole, or pseudohole, left eye: Secondary | ICD-10-CM | POA: Diagnosis not present

## 2023-10-10 DIAGNOSIS — H04123 Dry eye syndrome of bilateral lacrimal glands: Secondary | ICD-10-CM | POA: Diagnosis not present

## 2023-10-10 DIAGNOSIS — H401131 Primary open-angle glaucoma, bilateral, mild stage: Secondary | ICD-10-CM | POA: Diagnosis not present

## 2023-10-10 DIAGNOSIS — H35372 Puckering of macula, left eye: Secondary | ICD-10-CM | POA: Diagnosis not present

## 2023-10-13 ENCOUNTER — Ambulatory Visit (INDEPENDENT_AMBULATORY_CARE_PROVIDER_SITE_OTHER): Admitting: Family Medicine

## 2023-10-13 ENCOUNTER — Encounter: Payer: Self-pay | Admitting: Family Medicine

## 2023-10-13 VITALS — BP 130/82 | HR 70 | Ht 62.5 in | Wt 150.2 lb

## 2023-10-13 DIAGNOSIS — Z0000 Encounter for general adult medical examination without abnormal findings: Secondary | ICD-10-CM | POA: Diagnosis not present

## 2023-10-13 DIAGNOSIS — R5383 Other fatigue: Secondary | ICD-10-CM

## 2023-10-13 DIAGNOSIS — I2510 Atherosclerotic heart disease of native coronary artery without angina pectoris: Secondary | ICD-10-CM | POA: Diagnosis not present

## 2023-10-13 DIAGNOSIS — I10 Essential (primary) hypertension: Secondary | ICD-10-CM | POA: Diagnosis not present

## 2023-10-13 DIAGNOSIS — Z136 Encounter for screening for cardiovascular disorders: Secondary | ICD-10-CM

## 2023-10-13 DIAGNOSIS — Z23 Encounter for immunization: Secondary | ICD-10-CM

## 2023-10-13 DIAGNOSIS — R42 Dizziness and giddiness: Secondary | ICD-10-CM | POA: Diagnosis not present

## 2023-10-13 LAB — LIPID PANEL

## 2023-10-13 MED ORDER — METOPROLOL SUCCINATE ER 25 MG PO TB24
25.0000 mg | ORAL_TABLET | Freq: Every day | ORAL | 3 refills | Status: AC
Start: 2023-10-13 — End: ?

## 2023-10-13 NOTE — Progress Notes (Signed)
 Name: Olanna Percifield [facility]   Date of Visit: 10/13/23   Date of last visit with me: Visit date not found   CHIEF COMPLAINT:  Chief Complaint  Patient presents with   Annual Exam    AWV, fasting labs, dizziness, for a while now about 3 months,        HPI:  Discussed the use of AI scribe software for clinical note transcription with the patient, who gave verbal consent to proceed.  History of Present Illness   ELLAWYN WOGAN is an 88 year old female with arrhythmia who presents with dizziness and imbalance.  She experiences dizziness and a sensation of imbalance, described as feeling 'off' when walking. The dizziness is present even while sitting and is described as a fog in her head rather than a spinning sensation. She first noticed the dizziness when returning from the mailbox, which caused her to stop in her tracks. She is currently experiencing dizziness during the visit.  She mentions increased stress in her life and is unsure of its impact on her symptoms. Her water intake varies, with an average of two to three 12-ounce bottles per day, but not consistently. She drinks water with meals and keeps a bottle in her car for convenience.  She feels more tired than usual, leading to more frequent naps, particularly after lunch and in the early evening. She typically goes to bed around 11 PM.  Her current medications include metoprolol , which she takes at a dose of 25 mg twice a day, morning and evening. This dosage was increased by a nurse practitioner in April of the previous year. She also takes a combination pill of losartan  for blood pressure and a diuretic effect.  She has a history of stage three kidney disease, which has been stable over time.         OBJECTIVE:       10/13/2023   10:31 AM  Depression screen PHQ 2/9  Decreased Interest 0  Down, Depressed, Hopeless 0  PHQ - 2 Score 0     BP Readings from Last 3 Encounters:  10/13/23 130/82  10/04/23 (!) 145/81  05/02/23  132/70    BP 130/82   Pulse 70   Ht 5' 2.5 (1.588 m)   Wt 150 lb 3.2 oz (68.1 kg)   BMI 27.03 kg/m    Physical Exam          Physical Exam Constitutional:      Appearance: Normal appearance.  Neurological:     General: No focal deficit present.     Mental Status: She is alert and oriented to person, place, and time. Mental status is at baseline.     ASSESSMENT/PLAN:   Assessment & Plan Need for influenza vaccination  Medicare annual wellness visit, subsequent  Other fatigue  Dizziness  Primary hypertension  ASHD (arteriosclerotic heart disease)  Encounter for screening for cardiovascular disorders    Assessment and Plan    Dizziness Dizziness likely due to metoprolol  dosing, described as foggy sensation without vertigo, worsened by activity. - Change metoprolol  to 25 mg once daily. - Monitor symptoms and reassess in 4-5 weeks. - Perform basic laboratory tests, including kidney function assessment.  Cardiac arrhythmia Arrhythmia managed with metoprolol ; current dose may be excessive, contributing to dizziness. - Change metoprolol  to 25 mg once daily. - Reassess cardiac symptoms and medication efficacy in 4-5 weeks.  Chronic kidney disease stage 3 Concern for dizziness related to kidney function change, though less likely. - Perform  basic laboratory tests to assess kidney function.  Hypertension Hypertension managed with losartan -hydrochlorothiazide  and metoprolol ; blood pressure well-controlled. - Continue losartan -hydrochlorothiazide  as prescribed.     Medicare Wellness -As part of today's encounter, I reviewed the Medicare-specific components relevant to this patient's care. This included verification of eligibility for preventive services, assessment of screening needs based on age and risk factors, and review of applicable health maintenance items in alignment with current CMS guidelines. Preventive service recommendations and screenings were  discussed with the patient, and appropriate care was either - Patient getting flu shot.       Sianna Garofano A. Vita MD [facility]

## 2023-10-14 ENCOUNTER — Ambulatory Visit: Payer: Self-pay | Admitting: Family Medicine

## 2023-10-14 LAB — CBC WITH DIFFERENTIAL/PLATELET
Basophils Absolute: 0.1 x10E3/uL (ref 0.0–0.2)
Basos: 1 %
EOS (ABSOLUTE): 0.2 x10E3/uL (ref 0.0–0.4)
Eos: 4 %
Hematocrit: 43.5 % (ref 34.0–46.6)
Hemoglobin: 14.3 g/dL (ref 11.1–15.9)
Immature Grans (Abs): 0 x10E3/uL (ref 0.0–0.1)
Immature Granulocytes: 0 %
Lymphocytes Absolute: 1.3 x10E3/uL (ref 0.7–3.1)
Lymphs: 29 %
MCH: 29.1 pg (ref 26.6–33.0)
MCHC: 32.9 g/dL (ref 31.5–35.7)
MCV: 89 fL (ref 79–97)
Monocytes Absolute: 0.5 x10E3/uL (ref 0.1–0.9)
Monocytes: 11 %
Neutrophils Absolute: 2.4 x10E3/uL (ref 1.4–7.0)
Neutrophils: 54 %
Platelets: 230 x10E3/uL (ref 150–450)
RBC: 4.91 x10E6/uL (ref 3.77–5.28)
RDW: 13.5 % (ref 11.7–15.4)
WBC: 4.4 x10E3/uL (ref 3.4–10.8)

## 2023-10-14 LAB — COMPREHENSIVE METABOLIC PANEL WITH GFR
ALT: 23 IU/L (ref 0–32)
AST: 28 IU/L (ref 0–40)
Albumin: 4.2 g/dL (ref 3.7–4.7)
Alkaline Phosphatase: 67 IU/L (ref 48–129)
BUN/Creatinine Ratio: 10 — ABNORMAL LOW (ref 12–28)
BUN: 11 mg/dL (ref 8–27)
Bilirubin Total: 0.6 mg/dL (ref 0.0–1.2)
CO2: 21 mmol/L (ref 20–29)
Calcium: 9 mg/dL (ref 8.7–10.3)
Chloride: 101 mmol/L (ref 96–106)
Creatinine, Ser: 1.1 mg/dL — ABNORMAL HIGH (ref 0.57–1.00)
Globulin, Total: 2.7 g/dL (ref 1.5–4.5)
Glucose: 101 mg/dL — ABNORMAL HIGH (ref 70–99)
Potassium: 4.3 mmol/L (ref 3.5–5.2)
Sodium: 137 mmol/L (ref 134–144)
Total Protein: 6.9 g/dL (ref 6.0–8.5)
eGFR: 49 mL/min/1.73 — ABNORMAL LOW (ref >59)

## 2023-10-14 LAB — LIPID PANEL
Cholesterol, Total: 143 mg/dL (ref 100–199)
HDL: 74 mg/dL (ref >39)
LDL CALC COMMENT:: 1.9 ratio (ref 0.0–4.4)
LDL Chol Calc (NIH): 54 mg/dL (ref 0–99)
Triglycerides: 81 mg/dL (ref 0–149)
VLDL Cholesterol Cal: 15 mg/dL (ref 5–40)

## 2023-10-21 NOTE — Progress Notes (Signed)
 Called Pt and LVM for Pt to call in for results.

## 2023-10-25 NOTE — Telephone Encounter (Signed)
 Pt returned Riley's call re labs. Read lab results/notes from Dr Vita to patient No questions and confirmed her appt Nov 4th

## 2023-11-08 ENCOUNTER — Encounter: Payer: Self-pay | Admitting: Family Medicine

## 2023-11-08 ENCOUNTER — Ambulatory Visit: Payer: Self-pay | Admitting: Family Medicine

## 2023-11-08 VITALS — BP 122/74 | HR 80 | Wt 148.2 lb

## 2023-11-08 DIAGNOSIS — I2510 Atherosclerotic heart disease of native coronary artery without angina pectoris: Secondary | ICD-10-CM | POA: Diagnosis not present

## 2023-11-08 DIAGNOSIS — K219 Gastro-esophageal reflux disease without esophagitis: Secondary | ICD-10-CM

## 2023-11-08 DIAGNOSIS — E785 Hyperlipidemia, unspecified: Secondary | ICD-10-CM | POA: Diagnosis not present

## 2023-11-08 DIAGNOSIS — R42 Dizziness and giddiness: Secondary | ICD-10-CM | POA: Diagnosis not present

## 2023-11-08 MED ORDER — LOSARTAN POTASSIUM-HCTZ 50-12.5 MG PO TABS: 1.0000 | ORAL_TABLET | Freq: Every day | ORAL | 1 refills | Status: AC

## 2023-11-08 MED ORDER — OMEPRAZOLE 20 MG PO CPDR: 20.0000 mg | DELAYED_RELEASE_CAPSULE | Freq: Every day | ORAL | 3 refills | Status: AC

## 2023-11-08 MED ORDER — MECLIZINE HCL 50 MG PO TABS: 50.0000 mg | ORAL_TABLET | Freq: Three times a day (TID) | ORAL | 0 refills | Status: DC | PRN

## 2023-11-08 MED ORDER — FAMOTIDINE 40 MG PO TABS: 40.0000 mg | ORAL_TABLET | Freq: Every day | ORAL | 1 refills | Status: AC

## 2023-11-08 NOTE — Progress Notes (Signed)
 Name: [facility]   Date of Visit: 11/08/23   Date of last visit with me: 10/13/2023   CHIEF COMPLAINT:  Chief Complaint  Patient presents with   Follow-up    4 week follow up. Still dizzy, balance is off.        HPI:  Discussed the use of AI scribe software for clinical note transcription with the patient, who gave verbal consent to proceed.  History of Present Illness Tanya Berry is an 88 year old female with esophageal reflux and coronary artery disease who presents with dizziness and balance issues.  She experiences dizziness and balance issues, described as a 'fog' in her head, without a sensation of passing out. These symptoms have been present for an unspecified duration and worsen in the evening. She suspects her eyes or ears might contribute to the dizziness, as she sometimes experiences a film over her eyes and has issues with her hearing aids.  She has a history of esophageal reflux and takes omeprazole  for this condition. Attempts to discontinue the medication resulted in worsening esophageal burning. She has previously tried famotidine, which provided some relief. She takes her reflux medication in the morning before eating.  She has a history of coronary artery disease with a stent placed in 2001. She is currently on Crestor  for cholesterol and losartan  for blood pressure. She has reduced her metoprolol  dosage. She is concerned about the dizziness being related to her heart medications.  She drinks two to three cups of coffee in the morning and approximately four glasses of water throughout the day, acknowledging this may not be sufficient hydration. She does not consume Gatorade or similar drinks but prefers water.  Regarding her hearing, she has issues with her hearing aids and is considering having her ears cleaned, as her mother had similar issues. She is currently using one hearing aid and can hear adequately.     OBJECTIVE:       10/13/2023   10:31 AM   Depression screen PHQ 2/9  Decreased Interest 0  Down, Depressed, Hopeless 0  PHQ - 2 Score 0     BP Readings from Last 3 Encounters:  11/08/23 122/74  10/13/23 130/82  10/04/23 (!) 145/81    BP 122/74   Pulse 80   Wt 148 lb 3.2 oz (67.2 kg)   SpO2 98%   BMI 26.67 kg/m    Physical Exam    Physical Exam Constitutional:      Appearance: Normal appearance.  Cardiovascular:     Rate and Rhythm: Normal rate and regular rhythm.  Neurological:     General: No focal deficit present.     Mental Status: She is alert and oriented to person, place, and time. Mental status is at baseline.     ASSESSMENT/PLAN:   Assessment & Plan Gastroesophageal reflux disease without esophagitis  Dizziness  Hyperlipidemia with target LDL less than 70  ASHD (arteriosclerotic heart disease)    Assessment and Plan Assessment & Plan Dizziness and balance disturbance Persistent dizziness and balance disturbance, possibly due to dehydration, ear issues, or medication side effects. Differential includes cardiac, vestibular, or ocular causes. Dehydration suspected from low fluid intake and high caffeine consumption. - Increase fluid intake to 50-60 ounces daily. - Reduce caffeine intake. - Report symptom improvement in a few days. - Refer to ear specialist if no improvement. - Check and clean ears for wax buildup at next visit.  Gastroesophageal reflux disease Chronic GERD managed with omeprazole . Concerns about long-term use  due to potential side effects. Plan to switch to famotidine for better tolerability. - Prescribed famotidine as alternative to omeprazole . - Refilled omeprazole  prescription as backup.  Atherosclerotic heart disease of native coronary artery Coronary artery disease with stent placement in 2001. No recent cardiac events reported. - Continue current cardiac medications.  Primary hypertension Hypertension managed with metoprolol  and losartan -hydrochlorothiazide . Blood  pressure well-controlled. - Continue current antihypertensive regimen.  Hyperlipidemia - Continue metoprolol  for cholesterol management.     Tristan Bramble A. Vita MD [facility]

## 2023-11-08 NOTE — Patient Instructions (Addendum)
 Famotidine/Pepcid, please take this instead of the omprazole. If the famotidine/pepcid does not work for you, please go ahead and continue taking your omeprazole .   Please drink 60 ounces of water or gatorade a day

## 2023-11-09 ENCOUNTER — Telehealth: Payer: Self-pay | Admitting: Internal Medicine

## 2023-11-09 NOTE — Telephone Encounter (Unsigned)
 Copied from CRM [id number]. Topic: Clinical - Medication Question >> Nov 09, 2023  1:16 PM Yolanda T wrote: Reason for CRM: patient stated her pharmacy was not able to fill the script for meclizine (ANTIVERT) 50 MG tablet but they can do 25mg . Please f/u with [facility]

## 2023-11-10 MED ORDER — MECLIZINE HCL 25 MG PO TABS: 50.0000 mg | ORAL_TABLET | Freq: Three times a day (TID) | ORAL | 0 refills | Status: DC | PRN

## 2023-11-17 ENCOUNTER — Encounter: Payer: Self-pay | Admitting: Family Medicine

## 2023-11-17 ENCOUNTER — Ambulatory Visit (INDEPENDENT_AMBULATORY_CARE_PROVIDER_SITE_OTHER): Admitting: Family Medicine

## 2023-11-17 VITALS — BP 128/84 | HR 70 | Wt 150.2 lb

## 2023-11-17 DIAGNOSIS — R42 Dizziness and giddiness: Secondary | ICD-10-CM

## 2023-11-17 DIAGNOSIS — H6123 Impacted cerumen, bilateral: Secondary | ICD-10-CM

## 2023-11-17 DIAGNOSIS — R29898 Other symptoms and signs involving the musculoskeletal system: Secondary | ICD-10-CM

## 2023-11-17 MED ORDER — MECLIZINE HCL 25 MG PO TABS: 25.0000 mg | ORAL_TABLET | Freq: Three times a day (TID) | ORAL | 0 refills | Status: DC | PRN

## 2023-11-17 NOTE — Progress Notes (Signed)
   Name: [name]   Date of Visit: 11/17/23   Date of last visit with me: 11/08/2023   CHIEF COMPLAINT:  Chief Complaint  Patient presents with   Follow-up    1 week follow up on dizziness and balance, also wants ears clean.        HPI:  Discussed the use of AI scribe software for clinical note transcription with the patient, who gave verbal consent to proceed.  History of Present Illness   Tanya Berry is an 88 year old female who presents with dizziness and balance issues.  Tanya Berry has been experiencing dizziness since the end of August after returning from a cruise. The dizziness is less severe now but persists. Tanya Berry has increased her fluid intake, particularly water, but has not yet tried electrolyte-rich drinks.  Tanya Berry mentions that her medications contribute to her dizziness, although not as severely as before. Tanya Berry recently received a prescription for Pepcid but has not started it yet due to concerns about the dosage. Tanya Berry was previously on omeprazole .  Tanya Berry has concerns about her balance and recalls that physical therapy in the past was beneficial. Tanya Berry is interested in resuming physical therapy to strengthen her lower body, which Tanya Berry believes will help with her balance issues.  Tanya Berry reports dizziness when moving from sitting to standing.  Tanya Berry is currently using over-the-counter meclizine for dizziness, as there have been issues with receiving her prescription from [facility].         OBJECTIVE:       10/13/2023   10:31 AM  Depression screen PHQ 2/9  Decreased Interest 0  Down, Depressed, Hopeless 0  PHQ - 2 Score 0     BP Readings from Last 3 Encounters:  11/17/23 128/84  11/08/23 122/74  10/13/23 130/82    BP 128/84   Pulse 70   Wt 150 lb 3.2 oz (68.1 kg)   SpO2 98%   BMI 27.03 kg/m    Physical Exam          Physical Exam Constitutional:      Appearance: Normal appearance.  HENT:     Right Ear: Ear canal and external ear normal. There is  impacted cerumen.     Left Ear: Ear canal and external ear normal. There is impacted cerumen.  Neurological:     General: No focal deficit present.     Mental Status: Tanya Berry is alert and oriented to person, place, and time. Mental status is at baseline.     ASSESSMENT/PLAN:   Assessment & Plan Dizziness  Muscular deconditioning  Bilateral impacted cerumen  Orthostatic dizziness    Assessment and Plan    Chronic Dizziness and impaired balance with lower body weakness Dizziness persists, possibly due to vertigo or medication. Suspected orthostatic hypotension from inadequate cerebral perfusion. Dehydration and electrolyte imbalance may worsen symptoms. - Encouraged increased fluid intake with electrolytes. - Referred to physical therapy for lower body strengthening. - Resent meclizine prescription to [facility].  Impacted cerumen, bilateral Bilateral impacted cerumen may contribute to dizziness and balance issues. - Performed ear irrigation. - Cerumen disimpaction performed, tympanic membrane appears good.     Cerumen impaction noted bilaterally causing decreased hearing. Removal performed using suction and curette under direct visualization. Tympanic membranes visualized post-procedure; patient tolerated well.     Akyia Borelli A. Vita MD [facility]

## 2023-11-18 DIAGNOSIS — H26493 Other secondary cataract, bilateral: Secondary | ICD-10-CM | POA: Diagnosis not present

## 2023-11-18 DIAGNOSIS — H43811 Vitreous degeneration, right eye: Secondary | ICD-10-CM | POA: Diagnosis not present

## 2023-11-18 DIAGNOSIS — H35433 Paving stone degeneration of retina, bilateral: Secondary | ICD-10-CM | POA: Diagnosis not present

## 2023-11-18 DIAGNOSIS — H31092 Other chorioretinal scars, left eye: Secondary | ICD-10-CM | POA: Diagnosis not present

## 2023-11-18 DIAGNOSIS — H353132 Nonexudative age-related macular degeneration, bilateral, intermediate dry stage: Secondary | ICD-10-CM | POA: Diagnosis not present

## 2023-12-19 ENCOUNTER — Other Ambulatory Visit: Payer: Self-pay | Admitting: Family Medicine

## 2023-12-19 DIAGNOSIS — R42 Dizziness and giddiness: Secondary | ICD-10-CM

## 2024-01-03 ENCOUNTER — Encounter: Payer: Self-pay | Admitting: Family Medicine

## 2024-01-10 ENCOUNTER — Ambulatory Visit: Attending: Family Medicine | Admitting: Physical Therapy

## 2024-01-10 ENCOUNTER — Encounter: Payer: Self-pay | Admitting: Physical Therapy

## 2024-01-10 DIAGNOSIS — R5381 Other malaise: Secondary | ICD-10-CM | POA: Insufficient documentation

## 2024-01-10 DIAGNOSIS — R29898 Other symptoms and signs involving the musculoskeletal system: Secondary | ICD-10-CM | POA: Insufficient documentation

## 2024-01-10 DIAGNOSIS — M6281 Muscle weakness (generalized): Secondary | ICD-10-CM | POA: Insufficient documentation

## 2024-01-10 NOTE — Therapy (Signed)
 " OUTPATIENT PHYSICAL THERAPY LOWER EXTREMITY EVALUATION   Patient Name: Tanya Berry MRN: [id number] DOB:[birth year], 89 y.o., female Today's Date: 01/10/2024  END OF SESSION:  PT End of Session - 01/10/24 0757     Visit Number 1    Date for Recertification  04/10/23    Authorization Type Medicare    PT Start Time 0758    PT Stop Time 0840    PT Time Calculation (min) 42 min    Activity Tolerance Patient tolerated treatment well    Behavior During Therapy [facility] for tasks assessed/performed          Past Medical History:  Diagnosis Date   Allergy seasonal rhinitis   ASHD (arteriosclerotic heart disease) 2002   angioplasty   Diverticulosis    Dyslipidemia    GERD (gastroesophageal reflux disease)    Hearing loss, sensorineural, high frequency    Hemorrhoid    Hypertension    Menopause    Osteopenia    Past Surgical History:  Procedure Laterality Date   COLONOSCOPY  2003   Patient Active Problem List   Diagnosis Date Noted   LBBB (left bundle branch block) 06/12/2019   Stage 3a chronic kidney disease (HCC) 06/12/2019   Glaucoma of both eyes 06/05/2018   Gastroesophageal reflux disease without esophagitis 12/17/2014   Presbycusis of both ears 12/17/2014   Chronic back pain 10/01/2013   ASHD (arteriosclerotic heart disease) 08/05/2011   Hypertension 08/05/2011   Hyperlipidemia with target LDL less than 70 08/05/2011   Osteoporosis 08/05/2011    PCP: Vita Md  REFERRING PROVIDER: Vita, MD  REFERRING DIAG: weakness, deconditioning  THERAPY DIAG:  Muscle weakness (generalized)  Debility  Rationale for Evaluation and Treatment: Rehabilitation  ONSET DATE: 11/16/23  SUBJECTIVE:   SUBJECTIVE STATEMENT: Patient reports that she hsa difficulty getting up from sitting, difficulty shopping, she reports that her mms are tight and she is not flexible and is starting to stoop  PERTINENT HISTORY: Stent in heart, osteoporosis PAIN:  Are you having pain? Yes: NPRS  scale: 0/10 Pain location: across low back  Pain description: weak and tight Aggravating factors: getting up from sitting, walking, housework pain can be up to 7/10 Relieving factors: sit down and rest pain can be 0/10  PRECAUTIONS: None  RED FLAGS: None   WEIGHT BEARING RESTRICTIONS: No  FALLS:  Has patient fallen in last 6 months? Yes. Number of falls 1  LIVING ENVIRONMENT: Lives with: lives with their family and lives with their spouse Lives in: House/apartment Stairs: Yes: Internal: 12 steps; can reach both Has following equipment at home: None  OCCUPATION: retired  PLOF: Independent and does house work  PATIENT GOALS: feel stronger, less pain, feel better  NEXT MD VISIT: none scheduled  OBJECTIVE:  Note: Objective measures were completed at Evaluation unless otherwise noted.  DIAGNOSTIC FINDINGS: none  COGNITION: Overall cognitive status: Within functional limits for tasks assessed     SENSATION: WFL  MUSCLE LENGTH: Mild tightness HS, calves, piriformis  POSTURE: rounded shoulders, forward head, and decreased lumbar lordosis  PALPATION: She is tight in the thoracic area, has some scoliosis, non tender, very tight in the lumbar area  LUMBAR ROM:  Flexion decreased 50% other motions decreased 75% all increase LBP  LOWER EXTREMITY ROM:  WFL's but has pain and significant crepitus of the Right knee   LOWER EXTREMITY MMT:  MMT Right eval Left eval  Hip flexion 4- 4-  Hip extension    Hip abduction 4- 4-  Hip  adduction    Hip internal rotation    Hip external rotation    Knee flexion 4- 4  Knee extension 3+p! 4  Ankle dorsiflexion 4 4  Ankle plantarflexion    Ankle inversion    Ankle eversion     (Blank rows = not tested)  LOWER EXTREMITY SPECIAL TESTS:  Significant crepitus in the right knee  FUNCTIONAL TESTS:  5 times sit to stand: 45 seconds had to use hands on the thighs Timed up and go (TUG): 20 seconds 2 Minute walk test 200  feet BERG 33/56  GAIT: Distance walked: 200' Assistive device utilized: None Level of assistance: Complete Independence Comments: slow, antalgic right                                                                                                                                TREATMENT DATE:  01/10/23   Evaluation  HEP   PATIENT EDUCATION:  Education details: POC/HEP Person educated: Patient Education method: Programmer, Multimedia, Facilities Manager, Verbal cues, and Handouts Education comprehension: verbalized understanding  HOME EXERCISE PROGRAM: Access Code: [email or web address] URL: [email or web address] Date: 01/10/2024 Prepared by: Ozell Mainland  Exercises - Supine Lower Trunk Rotation  - 2 x daily - 7 x weekly - 1 sets - 10 reps - 3 hold - Supine Bridge  - 2 x daily - 7 x weekly - 1 sets - 10 reps - 3 hold - Standing March with Counter Support  - 2 x daily - 7 x weekly - 1 sets - 10 reps - 3 hold - Standing Hip Abduction with Counter Support  - 2 x daily - 7 x weekly - 1 sets - 10 reps - 3 hold  ASSESSMENT:  CLINICAL IMPRESSION: Patient is a 89 y.o. female who was seen today for physical therapy evaluation and treatment for weakness, during the evaluation she did have right knee and back pain and reported that she had a fall recently.  Testing revealed that she is at high risk for falls TUG 20 seconds, BERG 33/56.  She really had a hard time standing at first she said it was back pain but after a few times she said right knee pain.   OBJECTIVE IMPAIRMENTS: Abnormal gait, cardiopulmonary status limiting activity, decreased activity tolerance, decreased balance, decreased endurance, decreased mobility, difficulty walking, decreased ROM, decreased strength, decreased safety awareness, increased fascial restrictions, impaired perceived functional ability, increased muscle spasms, impaired flexibility, and pain.   REHAB POTENTIAL: Good  CLINICAL DECISION MAKING:  Stable/uncomplicated  EVALUATION COMPLEXITY: Low   GOALS: Goals reviewed with patient? Yes  SHORT TERM GOALS: Target date: 02/10/23 Independent with initial HEP Baseline: Goal status: INITIAL  LONG TERM GOALS: Target date: 04/10/23  Independent with advanced HEP Baseline:  Goal status: INITIAL  2.  Improve TUG time to 13 seconds Baseline: 20 seconds Goal status: INITIAL  3.  Improve 2 MWT to 400 feet Baseline: 200 feet Goal status: INITIAL  4.  Improve  BERG score to 46/56 Baseline: 33/56 Goal status: INITIAL  5.  Able to do 5XSTS in 28 seconds Baseline: 45 seconds Goal status: INITIAL  PLAN:  PT FREQUENCY: 1x/week  PT DURATION: 12 weeks  PLANNED INTERVENTIONS: 97164- PT Re-evaluation, 97110-Therapeutic exercises, 97530- Therapeutic activity, 97112- Neuromuscular re-education, 97535- Self Care, 02859- Manual therapy, [phone]- Gait training, [phone]- Electrical stimulation (unattended), Patient/Family education, Balance training, Stair training, Cryotherapy, and Moist heat  PLAN FOR NEXT SESSION: gym, core, total body strength, balance, flexibility   Kaya Pottenger W, PT 01/10/2024, 7:58 AM  "

## 2024-01-16 NOTE — Telephone Encounter (Signed)
 Copied from CRM [id number]. Topic: General - Billing Inquiry >> Jan 16, 2024 10:48 AM Thersia BROCKS wrote: Reason for CRM: Patient called in regarding billing issues, patient stated she wanted to speak to someone in the office regarding this would like a callback   Spoke to pt and we will re file that claim to insurance ( Z00.00 dx linked in errpr)

## 2024-01-17 ENCOUNTER — Other Ambulatory Visit: Payer: Self-pay | Admitting: Family Medicine

## 2024-01-17 DIAGNOSIS — E785 Hyperlipidemia, unspecified: Secondary | ICD-10-CM

## 2024-01-17 DIAGNOSIS — I2510 Atherosclerotic heart disease of native coronary artery without angina pectoris: Secondary | ICD-10-CM

## 2024-01-18 ENCOUNTER — Telehealth (HOSPITAL_COMMUNITY): Payer: Self-pay | Admitting: Pharmacist

## 2024-01-18 ENCOUNTER — Telehealth (HOSPITAL_COMMUNITY): Payer: Self-pay | Admitting: Pharmacy Technician

## 2024-01-18 NOTE — Telephone Encounter (Signed)
 Patient aware of site of care change to [facility] for Prolia   Last dose: 10/04/2023 Next dose: 04/03/2024  Sherry Pennant, PharmD, MPH, BCPS, CPP Clinical Pharmacist

## 2024-01-18 NOTE — Telephone Encounter (Signed)
 Auth Submission: NO AUTH NEEDED Site of care: [facility] Payer: MEDICARE A/B, TRICARE Medication & CPT/J Code(s) submitted: Prolia  (Denosumab ) [id number] Diagnosis Code: M81.0 Route of submission (phone, fax, portal):  Phone # Fax # Auth type: Buy/Bill HB Units/visits requested: 60mg  x 2 doses, q 6 months Reference number:  Approval from: 01/18/2024 to 01/03/25    Dagoberto Armour, CPhT [facility] Phone: [phone] 01/18/2024

## 2024-01-25 ENCOUNTER — Ambulatory Visit: Admitting: Physical Therapy

## 2024-01-25 ENCOUNTER — Encounter: Payer: Self-pay | Admitting: Physical Therapy

## 2024-01-25 DIAGNOSIS — R5381 Other malaise: Secondary | ICD-10-CM

## 2024-01-25 DIAGNOSIS — M6281 Muscle weakness (generalized): Secondary | ICD-10-CM | POA: Diagnosis not present

## 2024-01-25 NOTE — Therapy (Signed)
 " OUTPATIENT PHYSICAL THERAPY LOWER EXTREMITY EVALUATION   Patient Name: Tanya Berry MRN: [id number] DOB:Feb 01, 1935, 89 y.o., female Today's Date: 01/25/2024  END OF SESSION:  PT End of Session - 01/25/24 1426     Visit Number 2    Date for Recertification  04/10/23    Authorization Type Medicare    PT Start Time 1420    PT Stop Time 1500    PT Time Calculation (min) 40 min    Activity Tolerance Patient tolerated treatment well    Behavior During Therapy [facility] for tasks assessed/performed          Past Medical History:  Diagnosis Date   Allergy seasonal rhinitis   ASHD (arteriosclerotic heart disease) 2002   angioplasty   Diverticulosis    Dyslipidemia    GERD (gastroesophageal reflux disease)    Hearing loss, sensorineural, high frequency    Hemorrhoid    Hypertension    Menopause    Osteopenia    Past Surgical History:  Procedure Laterality Date   COLONOSCOPY  2003   Patient Active Problem List   Diagnosis Date Noted   LBBB (left bundle branch block) 06/12/2019   Stage 3a chronic kidney disease (HCC) 06/12/2019   Glaucoma of both eyes 06/05/2018   Gastroesophageal reflux disease without esophagitis 12/17/2014   Presbycusis of both ears 12/17/2014   Chronic back pain 10/01/2013   ASHD (arteriosclerotic heart disease) 08/05/2011   Hypertension 08/05/2011   Hyperlipidemia with target LDL less than 70 08/05/2011   Osteoporosis 08/05/2011    PCP: Vita Md  REFERRING PROVIDER: Vita, MD  REFERRING DIAG: weakness, deconditioning  THERAPY DIAG:  Muscle weakness (generalized)  Debility  Rationale for Evaluation and Treatment: Rehabilitation  ONSET DATE: 11/16/23  SUBJECTIVE:   SUBJECTIVE STATEMENT: Patient doing okay, she came late today, went to her dentist instead  PERTINENT HISTORY: Stent in heart, osteoporosis PAIN:  Are you having pain? Yes: NPRS scale: 0/10 Pain location: across low back  Pain description: weak and tight Aggravating factors:  getting up from sitting, walking, housework pain can be up to 7/10 Relieving factors: sit down and rest pain can be 0/10  PRECAUTIONS: None  RED FLAGS: None   WEIGHT BEARING RESTRICTIONS: No  FALLS:  Has patient fallen in last 6 months? Yes. Number of falls 1  LIVING ENVIRONMENT: Lives with: lives with their family and lives with their spouse Lives in: House/apartment Stairs: Yes: Internal: 12 steps; can reach both Has following equipment at home: None  OCCUPATION: retired  PLOF: Independent and does house work  PATIENT GOALS: feel stronger, less pain, feel better  NEXT MD VISIT: none scheduled  OBJECTIVE:  Note: Objective measures were completed at Evaluation unless otherwise noted.  DIAGNOSTIC FINDINGS: none  COGNITION: Overall cognitive status: Within functional limits for tasks assessed     SENSATION: WFL  MUSCLE LENGTH: Mild tightness HS, calves, piriformis  POSTURE: rounded shoulders, forward head, and decreased lumbar lordosis  PALPATION: She is tight in the thoracic area, has some scoliosis, non tender, very tight in the lumbar area  LUMBAR ROM:  Flexion decreased 50% other motions decreased 75% all increase LBP  LOWER EXTREMITY ROM:  WFL's but has pain and significant crepitus of the Right knee   LOWER EXTREMITY MMT:  MMT Right eval Left eval  Hip flexion 4- 4-  Hip extension    Hip abduction 4- 4-  Hip adduction    Hip internal rotation    Hip external rotation    Knee  flexion 4- 4  Knee extension 3+p! 4  Ankle dorsiflexion 4 4  Ankle plantarflexion    Ankle inversion    Ankle eversion     (Blank rows = not tested)  LOWER EXTREMITY SPECIAL TESTS:  Significant crepitus in the right knee  FUNCTIONAL TESTS:  5 times sit to stand: 45 seconds had to use hands on the thighs Timed up and go (TUG): 20 seconds 2 Minute walk test 200 feet BERG 33/56  GAIT: Distance walked: 200' Assistive device utilized: None Level of assistance:  Complete Independence Comments: slow, antalgic right                                                                                                                                TREATMENT DATE:  01/25/24 Nustep level 5 x 5 minutes Leg curls 15# 2x10 Feet on ball K2C, rotation, bridge, isometric abs Passive stretch LE's On airex ball toss Cone toe touch on and off airex Walking direction changes  01/10/23   Evaluation  HEP   PATIENT EDUCATION:  Education details: POC/HEP Person educated: Patient Education method: Programmer, Multimedia, Facilities Manager, Verbal cues, and Handouts Education comprehension: verbalized understanding  HOME EXERCISE PROGRAM: Access Code: [email or web address] URL: [email or web address] Date: 01/10/2024 Prepared by: Ozell Mainland  Exercises - Supine Lower Trunk Rotation  - 2 x daily - 7 x weekly - 1 sets - 10 reps - 3 hold - Supine Bridge  - 2 x daily - 7 x weekly - 1 sets - 10 reps - 3 hold - Standing March with Counter Support  - 2 x daily - 7 x weekly - 1 sets - 10 reps - 3 hold - Standing Hip Abduction with Counter Support  - 2 x daily - 7 x weekly - 1 sets - 10 reps - 3 hold  ASSESSMENT:  CLINICAL IMPRESSION: Patient really struggled with the airex, she was also very fatigued with exercises and had some cramping in the legs, with the balance on the airex she really needed CGA and 2x needed min A for LOB  Patient is a 89 y.o. female who was seen today for physical therapy evaluation and treatment for weakness, during the evaluation she did have right knee and back pain and reported that she had a fall recently.  Testing revealed that she is at high risk for falls TUG 20 seconds, BERG 33/56.  She really had a hard time standing at first she said it was back pain but after a few times she said right knee pain.   OBJECTIVE IMPAIRMENTS: Abnormal gait, cardiopulmonary status limiting activity, decreased activity tolerance, decreased balance, decreased  endurance, decreased mobility, difficulty walking, decreased ROM, decreased strength, decreased safety awareness, increased fascial restrictions, impaired perceived functional ability, increased muscle spasms, impaired flexibility, and pain.   REHAB POTENTIAL: Good  CLINICAL DECISION MAKING: Stable/uncomplicated  EVALUATION COMPLEXITY: Low   GOALS: Goals reviewed with patient? Yes  SHORT TERM GOALS: Target date: 02/10/23 Independent  with initial HEP Baseline: Goal status: progressing 01/25/24  LONG TERM GOALS: Target date: 04/10/23  Independent with advanced HEP Baseline:  Goal status: INITIAL  2.  Improve TUG time to 13 seconds Baseline: 20 seconds Goal status: INITIAL  3.  Improve 2 MWT to 400 feet Baseline: 200 feet Goal status: INITIAL  4.  Improve BERG score to 46/56 Baseline: 33/56 Goal status: INITIAL  5.  Able to do 5XSTS in 28 seconds Baseline: 45 seconds Goal status: INITIAL  PLAN:  PT FREQUENCY: 1x/week  PT DURATION: 12 weeks  PLANNED INTERVENTIONS: 97164- PT Re-evaluation, 97110-Therapeutic exercises, 97530- Therapeutic activity, 97112- Neuromuscular re-education, 97535- Self Care, 02859- Manual therapy, [phone]- Gait training, [phone]- Electrical stimulation (unattended), Patient/Family education, Balance training, Stair training, Cryotherapy, and Moist heat  PLAN FOR NEXT SESSION: gym, core, total body strength, balance, flexibility   Lyden Redner W, PT 01/25/2024, 2:29 PM  "

## 2024-02-02 ENCOUNTER — Ambulatory Visit: Admitting: Physical Therapy

## 2024-02-28 ENCOUNTER — Ambulatory Visit: Admitting: Physical Therapy

## 2024-04-03 ENCOUNTER — Encounter (HOSPITAL_COMMUNITY)

## 2024-04-03 ENCOUNTER — Ambulatory Visit

## 2024-10-30 ENCOUNTER — Encounter: Payer: Self-pay | Admitting: Family Medicine
